# Patient Record
Sex: Female | Born: 1970 | Race: White | Hispanic: No | Marital: Married | Smoking: Former smoker
Health system: Southern US, Community
[De-identification: ages and names within clinical notes are randomized; demographics above are authoritative.]

## PROBLEM LIST (undated history)

## (undated) ENCOUNTER — Inpatient Hospital Stay (HOSPITAL_COMMUNITY): Payer: Self-pay

## (undated) DIAGNOSIS — R51 Headache: Secondary | ICD-10-CM

## (undated) DIAGNOSIS — F411 Generalized anxiety disorder: Secondary | ICD-10-CM

## (undated) DIAGNOSIS — Z973 Presence of spectacles and contact lenses: Secondary | ICD-10-CM

## (undated) DIAGNOSIS — F329 Major depressive disorder, single episode, unspecified: Secondary | ICD-10-CM

## (undated) DIAGNOSIS — M722 Plantar fascial fibromatosis: Secondary | ICD-10-CM

## (undated) DIAGNOSIS — K648 Other hemorrhoids: Principal | ICD-10-CM

## (undated) DIAGNOSIS — I10 Essential (primary) hypertension: Secondary | ICD-10-CM

## (undated) DIAGNOSIS — R768 Other specified abnormal immunological findings in serum: Secondary | ICD-10-CM

## (undated) DIAGNOSIS — K219 Gastro-esophageal reflux disease without esophagitis: Secondary | ICD-10-CM

## (undated) DIAGNOSIS — M7662 Achilles tendinitis, left leg: Secondary | ICD-10-CM

## (undated) DIAGNOSIS — F909 Attention-deficit hyperactivity disorder, unspecified type: Secondary | ICD-10-CM

## (undated) DIAGNOSIS — D649 Anemia, unspecified: Secondary | ICD-10-CM

## (undated) HISTORY — PX: WISDOM TOOTH EXTRACTION: SHX21

## (undated) HISTORY — PX: OVUM / OOCYTE RETRIEVAL: SUR1269

## (undated) HISTORY — PX: DILATION AND CURETTAGE OF UTERUS: SHX78

## (undated) HISTORY — PX: ABDOMINAL HYSTERECTOMY: SHX81

## (undated) HISTORY — DX: Other hemorrhoids: K64.8

## (undated) HISTORY — DX: Gastro-esophageal reflux disease without esophagitis: K21.9

## (undated) HISTORY — DX: Headache: R51

---

## 1991-11-08 HISTORY — PX: FINGER SURGERY: SHX640

## 1999-02-23 ENCOUNTER — Other Ambulatory Visit: Admission: RE | Admit: 1999-02-23 | Discharge: 1999-02-23 | Payer: Self-pay | Admitting: *Deleted

## 2000-03-09 ENCOUNTER — Other Ambulatory Visit: Admission: RE | Admit: 2000-03-09 | Discharge: 2000-03-09 | Payer: Self-pay | Admitting: Obstetrics and Gynecology

## 2001-03-26 ENCOUNTER — Other Ambulatory Visit: Admission: RE | Admit: 2001-03-26 | Discharge: 2001-03-26 | Payer: Self-pay | Admitting: *Deleted

## 2006-03-31 ENCOUNTER — Ambulatory Visit (HOSPITAL_COMMUNITY): Admission: RE | Admit: 2006-03-31 | Discharge: 2006-03-31 | Payer: Self-pay | Admitting: Obstetrics and Gynecology

## 2008-04-07 ENCOUNTER — Encounter (INDEPENDENT_AMBULATORY_CARE_PROVIDER_SITE_OTHER): Payer: Self-pay | Admitting: Obstetrics and Gynecology

## 2008-04-07 ENCOUNTER — Inpatient Hospital Stay (HOSPITAL_COMMUNITY): Admission: AD | Admit: 2008-04-07 | Discharge: 2008-04-09 | Payer: Self-pay | Admitting: Obstetrics and Gynecology

## 2009-03-05 ENCOUNTER — Ambulatory Visit (HOSPITAL_COMMUNITY): Admission: RE | Admit: 2009-03-05 | Discharge: 2009-03-05 | Payer: Self-pay | Admitting: Obstetrics and Gynecology

## 2009-03-05 ENCOUNTER — Encounter (INDEPENDENT_AMBULATORY_CARE_PROVIDER_SITE_OTHER): Payer: Self-pay | Admitting: Obstetrics and Gynecology

## 2009-07-23 ENCOUNTER — Encounter: Admission: RE | Admit: 2009-07-23 | Discharge: 2009-07-23 | Payer: Self-pay | Admitting: Family Medicine

## 2010-03-18 ENCOUNTER — Observation Stay (HOSPITAL_COMMUNITY): Admission: AD | Admit: 2010-03-18 | Discharge: 2010-03-19 | Payer: Self-pay | Admitting: Obstetrics and Gynecology

## 2010-05-14 ENCOUNTER — Inpatient Hospital Stay (HOSPITAL_COMMUNITY): Admission: AD | Admit: 2010-05-14 | Discharge: 2010-05-14 | Payer: Self-pay | Admitting: Obstetrics and Gynecology

## 2010-05-26 ENCOUNTER — Inpatient Hospital Stay (HOSPITAL_COMMUNITY): Admission: RE | Admit: 2010-05-26 | Discharge: 2010-05-28 | Payer: Self-pay | Admitting: Obstetrics and Gynecology

## 2010-06-16 ENCOUNTER — Ambulatory Visit: Payer: Self-pay | Admitting: Family Medicine

## 2010-06-16 DIAGNOSIS — F325 Major depressive disorder, single episode, in full remission: Secondary | ICD-10-CM

## 2010-06-16 DIAGNOSIS — R5383 Other fatigue: Secondary | ICD-10-CM

## 2010-06-16 DIAGNOSIS — K219 Gastro-esophageal reflux disease without esophagitis: Secondary | ICD-10-CM | POA: Insufficient documentation

## 2010-06-16 DIAGNOSIS — I10 Essential (primary) hypertension: Secondary | ICD-10-CM

## 2010-06-16 DIAGNOSIS — E559 Vitamin D deficiency, unspecified: Secondary | ICD-10-CM | POA: Insufficient documentation

## 2010-06-18 ENCOUNTER — Encounter: Payer: Self-pay | Admitting: Family Medicine

## 2010-06-19 DIAGNOSIS — D509 Iron deficiency anemia, unspecified: Secondary | ICD-10-CM

## 2010-06-21 LAB — CONVERTED CEMR LAB
AST: 14 units/L (ref 0–37)
Albumin: 4.1 g/dL (ref 3.5–5.2)
Alkaline Phosphatase: 67 units/L (ref 39–117)
CO2: 19 meq/L (ref 19–32)
Chloride: 111 meq/L (ref 96–112)
Cholesterol: 186 mg/dL (ref 0–200)
Creatinine, Ser: 0.85 mg/dL (ref 0.40–1.20)
Glucose, Bld: 93 mg/dL (ref 70–99)
HDL: 63 mg/dL (ref >39)
Sodium: 142 meq/L (ref 135–145)
TSH: 0.49 microintl units/mL (ref 0.350–4.500)
Total Bilirubin: 0.3 mg/dL (ref 0.3–1.2)
Total Protein: 6 g/dL (ref 6.0–8.3)
Triglycerides: 82 mg/dL (ref <150)
VLDL: 16 mg/dL (ref 0–40)

## 2010-06-22 ENCOUNTER — Encounter: Payer: Self-pay | Admitting: Family Medicine

## 2010-10-26 ENCOUNTER — Ambulatory Visit: Payer: Self-pay | Admitting: Family Medicine

## 2010-10-26 ENCOUNTER — Encounter: Payer: Self-pay | Admitting: Family Medicine

## 2010-10-26 DIAGNOSIS — R454 Irritability and anger: Secondary | ICD-10-CM

## 2010-10-27 LAB — CONVERTED CEMR LAB
Ferritin: 39 ng/mL (ref 10–291)
Iron: 109 ug/dL (ref 42–145)
Platelets: 289 10*3/uL (ref 150–400)
RBC: 4.46 M/uL (ref 3.87–5.11)

## 2010-12-07 NOTE — Assessment & Plan Note (Signed)
Summary: NOV- reflux cough   Vital Signs:  Patient profile:   40 year old female Height:      64 inches Weight:      164 pounds BMI:     28.25 O2 Sat:      99 % on Room air Pulse rate:   90 / minute BP sitting:   142 / 88  (left arm) Cuff size:   regular  Vitals Entered By: Payton Spark CMA (June 16, 2010 2:47 PM)  O2 Flow:  Room air CC: New to est. 3 weeks postpartum. Requests labs and ? TDAP. Also c/o nonproductive cough x 2+ months    Primary Care Provider:  Seymour Bars DO  CC:  New to est. 3 weeks postpartum. Requests labs and ? TDAP. Also c/o nonproductive cough x 2+ months .  History of Present Illness: 40 yo WF presents for for NOV.  She is 3 wks postpartum.  She is married.    She has had a cough x 2 months.  It started when she was pregnant.  Her cough is dry.  It occurs randomly.  Has episodes of sour brash taste and sore throat.  Non smoker.  No hx of asthma.  No sputum or hemoptysis.    She took Prilosec daily during pregnancy and is now taking Zantac as needed.    She has a long hx of depression, well managed on Wellubtrin 300 mg/ day.  She has a supportive husband.      Current Medications (verified): 1)  Wellbutrin Xl 300 Mg Xr24h-Tab (Bupropion Hcl) .... Take 1 Tab By Mouth Once Daily 2)  Prenatal Vitamins 0.8 Mg Tabs (Prenatal Multivit-Min-Fe-Fa) 3)  Zantac 75 75 Mg Tabs (Ranitidine Hcl) .... Take 1-2 Tabs By Mouth Once Daily As Needed  Allergies (verified): No Known Drug Allergies  Past History:  Past Medical History: depression gyn Dr Ellyn Hack  Past Surgical History: colonoscopy: anal fissure 08 IVF 2010. 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 Married to [place].  Has a 27 yo daughter and a newborn son. No exercise. Denies ETOH.  Review of Systems       no  fevers/sweats/weakness, unexplained wt loss/gain, no change in vision, no difficulty hearing, ringing in ears, no hay fever/allergies, no CP/discomfort, no palpitations, no breast lump/nipple discharge, no cough/wheeze, no blood in stool, no N/V/D, no nocturia, no leaking urine, no unusual vag bleeding, no vaginal/penile discharge, no muscle/joint pain, no rash, no new/changing mole, no HA, no memory loss, no anxiety, no sleep problem, no depression, no unexplained lumps, no easy bruising/bleeding, no concern with sexual function   Physical Exam  General:  alert, well-developed, well-nourished, and well-hydrated.   Head:  normocephalic and atraumatic.   Eyes:  sclera non icteric Mouth:  good dentition and pharynx pink and moist.   Neck:  no masses.   Lungs:  Normal respiratory effort, chest expands symmetrically. Lungs are clear to auscultation, no crackles or wheezes.  dry cough Heart:  Normal rate and regular rhythm. S1 and S2 normal without gallop, murmur, click, rub or other extra sounds. Abdomen:  slight epigastric TTP, neg Murphys sign, no hepatomegaly and no splenomegaly.   Extremities:  no LE edema Skin:  color normal.   Cervical Nodes:  No lymphadenopathy noted Psych:  good eye contact, not anxious appearing, and not depressed appearing.  Impression & Recommendations:  Problem # 1:  ESOPHAGEAL REFLUX (ICD-530.81)  She has a reflux induced cough that started with pregnancy.  Will start her on Omeprazole once a day and stick to reflux precautions.  Cough should improve with treatment.  Reassured about normal lung exam and pulse ox.  She is not a smoker and she does have epigastric tenderness and sour brash taste c/w reflux cough.  RTC in 2-3 mos for f/u.  Will try to taper off. Her updated medication list for this problem includes:    Omeprazole 40 Mg Cpdr (Omeprazole) .Marland Kitchen... 1 tab by mouth daily; take 30 min before breakfast  Problem # 2:  DEPRESSION (ICD-311) Long hx of  depression, doing well in the postpartum state on wellbutrin.  continue.  has a good support system.  Will do a PHQ-9 at 2 mos f/u visit. Her updated medication list for this problem includes:    Wellbutrin Xl 300 Mg Xr24h-tab (Bupropion hcl) .Marland Kitchen... Take 1 tab by mouth once daily  Problem # 3:  ELEVATED BLOOD PRESSURE (ICD-796.2) BP slightly high today.  REcheck at 2-3 mos f/u.  Complete Medication List: 1)  Wellbutrin Xl 300 Mg Xr24h-tab (Bupropion hcl) .... Take 1 tab by mouth once daily 2)  Prenatal Vitamins 0.8 Mg Tabs (Prenatal multivit-min-fe-fa) 3)  Omeprazole 40 Mg Cpdr (Omeprazole) .Marland Kitchen.. 1 tab by mouth daily; take 30 min before breakfast  Other Orders: T-Vitamin D (25-Hydroxy) ([id number]) T-TSH [phone]) T-Iron [phone]) T-Comprehensive Metabolic Panel [id number]) T-Lipid Profile [id number])  Patient Instructions: 1)  Tdap today. 2)  Update fasting labs downstairs. 3)  Will call you w/ results. 4)  Start on Omeprazole once a day instead of Zantac for reflux.  Take 20-30 min before breakfast. 5)  REturn for f/u reflux in 3 months and to recheck BP. Prescriptions: OMEPRAZOLE 40 MG CPDR (OMEPRAZOLE) 1 tab by mouth daily; take 30 min before breakfast  #30 x 2   Entered and Authorized by:   Seymour Bars DO   Signed by:   Seymour Bars DO on 06/16/2010   Method used:   Electronically to        [facility] [phone]* (retail)       [address] [place], [place]  [address]       Ph: [phone] or [phone]       Fax: [phone]   RxID:   [id number]

## 2010-12-07 NOTE — Medication Information (Signed)
Summary: Approval for Omeprazole/[employer]  Approval for Omeprazole/[employer]   Imported By: Lanelle Bal 06/29/2010 13:00:48  _____________________________________________________________________  External Attachment:    Type:   Image     Comment:   External Document

## 2010-12-09 NOTE — Assessment & Plan Note (Signed)
Summary: f/u mood   Vital Signs:  Patient profile:   40 year old female Height:      64 inches Weight:      141 pounds BMI:     24.29 O2 Sat:      99 % on Room air Pulse rate:   94 / minute BP sitting:   140 / 84  (left arm) Cuff size:   regular  Vitals Entered By: Payton Spark CMA (October 26, 2010 8:22 AM)  O2 Flow:  Room air CC: F/U reflux, iron and BP   Primary Care Provider:  Seymour Bars DO  CC:  F/U reflux and iron and BP.  History of Present Illness: 40 yo WF presents for f/u reflux.  She is no longer having symptoms.  She took Omeprazole for 1 month.  Her reflux did improve after she had her baby 5 mos ago.    She does note bouts irritabilty, fatigue and nausea.  Wellbutrin is not helping as much as she thinks it should but she has been on many other SSRIs and SNRIs in the past and feels like she needs to be on something.  She has a good appetite but she is losing weight.  She is breastfeeding.  She has poor sleep at night due to nighttime pumping and has yet to start getting exercise.  She is also back to work now.  She stopped the RX iron after taking it for a month.      Current Medications (verified): 1)  Wellbutrin Xl 300 Mg Xr24h-Tab (Bupropion Hcl) .... Take 1 Tab By Mouth Once Daily 2)  Omeprazole 40 Mg Cpdr (Omeprazole) .Marland Kitchen.. 1 Tab By Mouth Daily; Take 30 Min Before Breakfast 3)  Vitamin D  Allergies (verified): No Known Drug Allergies  Past History:  Past Medical History: Reviewed history from 06/16/2010 and no changes required. depression gyn Dr Ellyn Hack  Past Surgical History: Reviewed history from 06/16/2010 and no changes required. colonoscopy: anal fissure 08 IVF 2010. NSVD x 2  Family History: Reviewed history from 06/16/2010 and no changes required. father HTN mother depression  2 brothers and 1 sister healthy uncle brain cancer GF lung cancer GM depression  Social History: Reviewed history from 06/16/2010 and no changes  required. 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  Has a 53 yo daughter and a newborn son. No exercise. Denies ETOH.  Review of Systems      See HPI  Physical Exam  General:  alert, well-developed, well-nourished, and well-hydrated.   Head:  normocephalic and atraumatic.   Mouth:  pharynx pink and moist.   Neck:  no masses.   Lungs:  Normal respiratory effort, chest expands symmetrically. Lungs are clear to auscultation, no crackles or wheezes. Heart:  Normal rate and regular rhythm. S1 and S2 normal without gallop, murmur, click, rub or other extra sounds. Abdomen:  soft, non-tender, normal bowel sounds, no distention, no masses, no guarding, no hepatomegaly, and no splenomegaly.   Skin:  color normal.  no pallor Psych:  good eye contact, not anxious appearing, and not depressed appearing.     Impression & Recommendations:  Problem # 1:  DEPRESSION (ICD-311) We discussed possibly making some changes but she is nursing and is has been on multiple other agents in the past, none of which has helped much.  I do think that the Wellbutrin is helping some and that she would benefit from improving her sleep at night, regularly exercising and working on stress reduction.  Her updated medication list for this problem includes:    Wellbutrin Xl 300 Mg Xr24h-tab (Bupropion hcl) .Marland Kitchen... Take 1 tab by mouth once daily  Problem # 2:  IRON DEFICIENCY (ICD-280.9) Off RX iron.  rEcheck labs today. The following medications were removed from the medication list:    Integra Plus Caps (Fefum-fepoly-fa-b cmp-c-biot) .Marland Kitchen... 1 tab by mouth daily  Orders: T-CBC No Diff ([id number]) T-Iron [phone]) T-Iron Binding Capacity (TIBC) ([id number]) T-Ferritin ([id number])  Problem # 3:  ESOPHAGEAL REFLUX (ICD-530.81) Assessment: Improved Improved 5 mos post partum and is off PPI.  Not even needing anything for rescue symptoms. The following medications were removed from the  medication list:    Omeprazole 40 Mg Cpdr (Omeprazole) .Marland Kitchen... 1 tab by mouth daily; take 30 min before breakfast  Complete Medication List: 1)  Wellbutrin Xl 300 Mg Xr24h-tab (Bupropion hcl) .... Take 1 tab by mouth once daily 2)  Vitamin D   Other Orders: T-TSH ([id number])  Patient Instructions: 1)  Labs today. 2)  Will call you w/ results tomorrow. 3)  Stay on Wellbutrin. 4)  Work on improving sleep and adding regular exercise, (consider yoga for relaxation/ meditation). 5)  Call back if any problems. 6)  REturn for follow up mood in 4 mos.   Orders Added: 1)  T-CBC No Diff [[id number]] 2)  T-Iron [[id number]] 3)  T-Iron Binding Capacity (TIBC) [[id number]] 4)  T-TSH [[id number]] 5)  T-Ferritin [[id number]] 6)  Est. Patient Level IV [88416]

## 2011-01-22 LAB — CBC
Hemoglobin: 12.2 g/dL (ref 12.0–15.0)
MCHC: 35.8 g/dL (ref 30.0–36.0)
MCV: 94.1 fL (ref 78.0–100.0)
Platelets: 213 10*3/uL (ref 150–400)
Platelets: 216 10*3/uL (ref 150–400)
RBC: 2.73 MIL/uL — ABNORMAL LOW (ref 3.87–5.11)
RBC: 3.74 MIL/uL — ABNORMAL LOW (ref 3.87–5.11)
RDW: 14.2 % (ref 11.5–15.5)
WBC: 14.7 10*3/uL — ABNORMAL HIGH (ref 4.0–10.5)

## 2011-01-22 LAB — RPR: RPR Ser Ql: NONREACTIVE

## 2011-02-16 LAB — CBC
Hemoglobin: 12.9 g/dL (ref 12.0–15.0)
MCV: 94.3 fL (ref 78.0–100.0)
Platelets: 249 10*3/uL (ref 150–400)
RBC: 3.83 MIL/uL — ABNORMAL LOW (ref 3.87–5.11)
WBC: 8.6 10*3/uL (ref 4.0–10.5)

## 2011-03-22 NOTE — Op Note (Signed)
Anita Alexander, Anita Alexander                ACCOUNT NO.:  [id number]   MEDICAL RECORD NO.:  [id number]          PATIENT TYPE:  AMB   LOCATION:  SDC                           FACILITY:  [facility]   PHYSICIAN:  Zenaida Niece, M.D.DATE OF BIRTH:  [birth year]   DATE OF PROCEDURE:  03/05/2009  DATE OF DISCHARGE:                               OPERATIVE REPORT   PREOPERATIVE DIAGNOSIS:  Missed abortion.   POSTOPERATIVE DIAGNOSIS:  Missed abortion.   PROCEDURE:  Dilation and evacuation.   SURGEON:  Zenaida Niece, MD   ANESTHESIA:  Monitored anesthesia care and local paracervical block.   FINDINGS:  Slightly enlarged uterus and cervix was easily dilated.   SPECIMENS:  Products of conception sent for routine pathology.   ESTIMATED BLOOD LOSS:  Minimal.   COMPLICATIONS:  None.   PROCEDURE IN DETAIL:  The patient was taken to the operating room and  placed in the dorsal supine position.  She was given IV sedation and  placed on mobile stirrups.  Perineum and vagina were then prepped and  draped in the usual sterile fashion and bladder drained with a red  Robinson catheter.  A Graves speculum was then inserted into the vagina.  The anterior lip of the cervix was grasped with a single-tooth  tenaculum.  Paracervical block was then performed with a total of 16 mL  of 2% plain lidocaine.  The uterus then sounded to 9 cm and the cervix  was gradually easily dilated to accommodate a size 23 dilator.  A size 7  curved suction curette was then inserted and suction curettage was  performed with return of products of conception without difficulty.  Sharp curettage was then performed with good uterine cry in all  quadrants with possibly a small amount of tissue posteriorly.  Suction  curettage was performed one more time with return of a small amount of  tissue and minimal blood.  This was then removed.  The single-tooth  tenaculum was removed.  Bleeding was controlled with pressure and the  speculum  was removed.  The patient was taken down from stirrups.  She  was awakened in the operating room and taken to the recovery room in  stable condition after tolerating the procedure well.  Counts were  correct.      Zenaida Niece, M.D.  Electronically Signed     TDM/MEDQ  D:  03/05/2009  T:  03/06/2009  Job:  [id number]

## 2011-03-22 NOTE — Discharge Summary (Signed)
Anita Alexander, Anita Alexander                ACCOUNT NO.:  [id number]   MEDICAL RECORD NO.:  [id number]          PATIENT TYPE:  INP   LOCATION:  9124                          FACILITY:  [facility]   PHYSICIAN:  Malachi Pro. Ambrose Mantle, M.D. DATE OF BIRTH:  1971-01-22   DATE OF ADMISSION:  04/07/2008  DATE OF DISCHARGE:  04/09/2008                               DISCHARGE SUMMARY   A 40 year old white female para 0 gravida 1 at 51 weeks' gestation,  pregnancy conceived with IUI, presented to labor and delivery with  contractions every 5-8 minutes and cervical change to 2-cm, 90%, vertex  at -1.  Prenatal care was uneventful except for the history of anxiety  and depression treated with Wellbutrin by a psychiatrist, intrauterine  insemination for history of infertility.  Blood group and type A  positive, negative antibody, nonreactive serology, rubella immune,  hepatitis B surface antigen negative, HIV negative, GC negative, and  chlamydia negative, group B strep negative, one-hour glucola 97.  First  trimester screen was normal.  The patient had a history of anxiety,  depression, migraines, mitral valve prolapse not requiring antibiotics,  and GERD.   OBSTETRICAL HISTORY:  Negative.   GYN HISTORY:  Positive for infertility.   SURGICAL HISTORY:  Negative.   ALLERGIES:  No known allergies.   MEDICATIONS:  Zantac 150 mg 3 times a day, Wellbutrin 300 mg every day,  and prenatal vitamins.   On admission, the patient's vital signs were normal.  Heart and lungs  were normal.  The abdomen was soft and nontender.  Cervix was 2-3 cm,  90%.  The patient was admitted and received an epidural, and  intrauterine pressure catheter was placed, at 1:30 p.m. was contracting  every 2-3 minutes on 8 milliunits per minute of Pitocin.  Cervix was 6-7  cm per the nurse.  She reached full dilatation with meconium-stained  fluid.  She pushed well and delivered spontaneously OA over bilateral  deep vaginal lacerations, a  living female infant 7 pounds 14 ounces,  Apgars of 7 at 1 minute and 9 at 5 minutes.  Dr. Venetia Constable was in  attendance.  There was no meconium below the cords.  There was  significant bleeding from the vaginal lacerations.  The placenta did not  separate promptly so packs were used to identify the lacerations, and  they were repaired with 3-0 Vicryl.  Placenta was removed intact.  Uterus was normal.  All packs were removed and rectal was negative  before and after repair.  Blood loss was about 600 mL.  Postpartum, the  patient did well and was discharged on the second postpartum day.   LABORATORY DATA:  Initial hemoglobin was 12.0, hematocrit 34.0, white  count 10,700, and platelet count 265,000.  Followup hemoglobin 12.1 and  hematocrit 35.1.   FINAL DIAGNOSES:  Intrauterine pregnancy at 40 weeks, delivered OA.   OPERATION:  Spontaneous delivery OA, repair of bilateral vaginal  lacerations.   FINAL CONDITION:  Improved.   INSTRUCTIONS:  Include our regular discharge instruction booklet.  Percocet 5/325 mg 30 tablets 1 every 4-6 hours as needed  for pain and  Motrin 600 mg 30 tablets 1 every 6 hours as needed for pain.  The  patient is advised to follow our discharge instruction booklet and to  take her Wellbutrin and Zantac as before the delivery and return to the  office in 6 weeks for followup examination.      Malachi Pro. Ambrose Mantle, M.D.  Electronically Signed     TFH/MEDQ  D:  04/09/2008  T:  04/09/2008  Job:  [id number]

## 2011-07-15 ENCOUNTER — Other Ambulatory Visit (HOSPITAL_COMMUNITY): Payer: Self-pay | Admitting: Obstetrics and Gynecology

## 2011-07-15 DIAGNOSIS — Z1231 Encounter for screening mammogram for malignant neoplasm of breast: Secondary | ICD-10-CM

## 2011-07-22 ENCOUNTER — Ambulatory Visit (HOSPITAL_COMMUNITY)
Admission: RE | Admit: 2011-07-22 | Discharge: 2011-07-22 | Disposition: A | Discharge status: Home / Self Care | Payer: BC Managed Care – PPO | Source: Ambulatory Visit | Attending: Obstetrics and Gynecology | Admitting: Obstetrics and Gynecology

## 2011-07-22 DIAGNOSIS — Z1231 Encounter for screening mammogram for malignant neoplasm of breast: Secondary | ICD-10-CM | POA: Insufficient documentation

## 2011-08-04 LAB — CCBB MATERNAL DONOR DRAW

## 2011-08-04 LAB — CBC
HCT: 35.1 — ABNORMAL LOW
Hemoglobin: 12
Hemoglobin: 12.1
MCHC: 35.4
MCV: 89.9
WBC: 16.6 — ABNORMAL HIGH

## 2011-08-04 LAB — RPR: RPR Ser Ql: NONREACTIVE

## 2012-04-11 ENCOUNTER — Ambulatory Visit (INDEPENDENT_AMBULATORY_CARE_PROVIDER_SITE_OTHER): Payer: 59 | Admitting: Physician Assistant

## 2012-04-11 ENCOUNTER — Encounter: Payer: Self-pay | Admitting: Physician Assistant

## 2012-04-11 VITALS — BP 123/77 | HR 98 | Temp 98.5°F | Ht 65.0 in | Wt 147.0 lb

## 2012-04-11 DIAGNOSIS — K219 Gastro-esophageal reflux disease without esophagitis: Secondary | ICD-10-CM

## 2012-04-11 DIAGNOSIS — R1011 Right upper quadrant pain: Secondary | ICD-10-CM

## 2012-04-11 MED ORDER — PANTOPRAZOLE SODIUM 40 MG PO TBEC: 40.0000 mg | DELAYED_RELEASE_TABLET | Freq: Every day | ORAL | Status: DC

## 2012-04-11 NOTE — Patient Instructions (Signed)
Gave protonix to start 40mg  daily first thing in the morning. Should see results in about a 1 week. If not improving then we will scheduled EGD.

## 2012-04-11 NOTE — Progress Notes (Signed)
  Subjective:    Patient ID: Anita Alexander, female    DOB: 1971/08/11, 41 y.o.   MRN: [id number]  HPI Patient presents to the clinic with right upper quadrant pain for the last 6 weeks. She has had episodes like this one for the last 2 years. They last for 6-8 weeks and then resolve on their own. She has had numerous Gallbladder ultrasounds that were normal. [facility] and [facility] were going to schedule EGD but pain resolved and she never got it done. She does not noticed any triggers except maybe stress. She does not experience any worsening with food. She does have a hx of acid reflux but has not been on any medication is a while. She does report that she has felt like her acid reflex symptoms have worsened over the last couple of months. She denies any N/V or bowel changes. The pain is a dull cramp that she describes as more annoying that painful. She rates pain 4/10. She denies fever or chills. She has not tried anything to make better.     Review of Systems     Objective:   Physical Exam  Constitutional: She is oriented to person, place, and time. She appears well-developed and well-nourished.  HENT:  Head: Normocephalic and atraumatic.  Cardiovascular: Normal rate, regular rhythm and normal heart sounds.   Pulmonary/Chest: Effort normal and breath sounds normal. She has no wheezes.  Abdominal: Soft. Bowel sounds are normal. She exhibits no distension.       Tenderness with deep palpation over right upper quadrant and epigastric area. No rebound tenderness.   Stretch marks over abdomen.   Neurological: She is alert and oriented to person, place, and time.  Skin: Skin is warm and dry.  Psychiatric: She has a normal mood and affect. Her behavior is normal.          Assessment & Plan:    Esophageal reflux/right upper quadrant abdominal pain- Has had multiple gallbladder u/s all normal. Due to hx of worsening during stress and personal hx of acid reflux that we  should restart protonix daily and she if it helps her symptoms/pain. If no relief we can consider EGD/GI referral. Stay away from caffeine, sodas, spicy foods, and NSAIDS. CBC to look at hemoglobin.

## 2012-04-12 LAB — CBC WITH DIFFERENTIAL/PLATELET
Basophils Relative: 0 % (ref 0–1)
Eosinophils Absolute: 0.1 10*3/uL (ref 0.0–0.7)
HCT: 39.6 % (ref 36.0–46.0)
Lymphocytes Relative: 41 % (ref 12–46)
MCH: 31.6 pg (ref 26.0–34.0)
Monocytes Absolute: 0.6 10*3/uL (ref 0.1–1.0)
Neutro Abs: 3.6 10*3/uL (ref 1.7–7.7)
Platelets: 283 10*3/uL (ref 150–400)
RDW: 13.7 % (ref 11.5–15.5)

## 2012-04-12 NOTE — Progress Notes (Signed)
Quick Note:  Call patient with results. Let them know all labs are within normal limits. ______ 

## 2012-09-27 ENCOUNTER — Encounter (HOSPITAL_COMMUNITY): Payer: Self-pay

## 2012-09-27 ENCOUNTER — Encounter (HOSPITAL_COMMUNITY): Payer: Self-pay | Admitting: *Deleted

## 2012-10-01 ENCOUNTER — Ambulatory Visit (HOSPITAL_COMMUNITY): Admission: RE | Admit: 2012-10-01 | Payer: 59 | Source: Ambulatory Visit | Admitting: Obstetrics and Gynecology

## 2012-10-01 ENCOUNTER — Encounter (HOSPITAL_COMMUNITY): Payer: Self-pay | Admitting: Registered Nurse

## 2012-10-01 ENCOUNTER — Encounter (HOSPITAL_COMMUNITY): Admission: RE | Payer: Self-pay | Source: Ambulatory Visit

## 2012-10-01 SURGERY — DILATION AND EVACUATION, UTERUS
Anesthesia: General

## 2012-12-26 ENCOUNTER — Encounter: Payer: Self-pay | Admitting: Physician Assistant

## 2012-12-26 ENCOUNTER — Ambulatory Visit (INDEPENDENT_AMBULATORY_CARE_PROVIDER_SITE_OTHER): Payer: 59 | Admitting: Physician Assistant

## 2012-12-26 VITALS — BP 131/80 | HR 85 | Wt 156.0 lb

## 2012-12-26 DIAGNOSIS — R0789 Other chest pain: Secondary | ICD-10-CM

## 2012-12-26 DIAGNOSIS — R059 Cough, unspecified: Secondary | ICD-10-CM

## 2012-12-26 DIAGNOSIS — R05 Cough: Secondary | ICD-10-CM

## 2012-12-26 DIAGNOSIS — F411 Generalized anxiety disorder: Secondary | ICD-10-CM

## 2012-12-26 DIAGNOSIS — K219 Gastro-esophageal reflux disease without esophagitis: Secondary | ICD-10-CM

## 2012-12-26 MED ORDER — PANTOPRAZOLE SODIUM 40 MG PO TBEC: 40.0000 mg | DELAYED_RELEASE_TABLET | Freq: Every day | ORAL | Status: DC

## 2012-12-26 MED ORDER — HYDROCODONE-HOMATROPINE 5-1.5 MG/5ML PO SYRP: 5.0000 mL | ORAL_SOLUTION | Freq: Every evening | ORAL | Status: DC | PRN

## 2012-12-26 MED ORDER — BUPROPION HCL ER (XL) 150 MG PO TB24: 150.0000 mg | ORAL_TABLET | Freq: Every day | ORAL | Status: DC

## 2012-12-26 MED ORDER — BUSPIRONE HCL 7.5 MG PO TABS: 7.5000 mg | ORAL_TABLET | Freq: Three times a day (TID) | ORAL | Status: DC

## 2012-12-26 NOTE — Patient Instructions (Addendum)
STart Wellburtin once a day. Start buspar twice a day and can increase to three times a day. Follow up in 6 weeks.

## 2012-12-26 NOTE — Progress Notes (Addendum)
  Subjective:    Patient ID: Anita Alexander, female    DOB: [birth year], 42 y.o.   MRN: [id number]  HPI Patient is a 42 yo female who presents to the clinic with chest tightness for about 10 days. Tightness is best in the morning and gets worse throughout the day but not necessarly with exertion. Nothing makes worse just gradually get there. No chest pain and no radiation. NO jaw pain or arm pain. Denies any fever, SOB, wheezing. Does have a cough that just started and is not productive. Pt has depression and anxiety and taking wellbutrin SR once a day. She feels overwhelmed. She has been seen for acid reflux problems in the past but those symptoms are resolved for the most part. She has ran out of protonix and having to use OTC prilosec but not regularly.    Review of Systems     Objective:   Physical Exam  Constitutional: She is oriented to person, place, and time. She appears well-developed and well-nourished.  HENT:  Head: Normocephalic and atraumatic.  Right Ear: External ear normal.  Left Ear: External ear normal.  Nose: Nose normal.  Mouth/Throat: Oropharynx is clear and moist.  Eyes: Conjunctivae are normal.  Neck: Normal range of motion. Neck supple.  Cardiovascular: Normal rate, regular rhythm and normal heart sounds.   Pulmonary/Chest: Effort normal and breath sounds normal.  Abdominal: Soft. Bowel sounds are normal. There is no tenderness.  Lymphadenopathy:    She has no cervical adenopathy.  Neurological: She is alert and oriented to person, place, and time.  Skin: Skin is warm and dry.  Psychiatric: She has a normal mood and affect. Her behavior is normal.          Assessment & Plan:  Anxiety/Chest tightness-discussed with patient sounded stress related. GAD-7 was 15. PHQ-9 was 6.  Pt not on adequate dose of wellbutrin put on XR once a day 150 and added buspar to see if helped with chest tightness. Follow up in 6 weeks.   Cough- given hycodan to use at night.    Acid reflux- Go back on protonix to see if that could be causing some discomfort. Refilled.

## 2013-02-08 ENCOUNTER — Ambulatory Visit (INDEPENDENT_AMBULATORY_CARE_PROVIDER_SITE_OTHER): Payer: 59 | Admitting: Physician Assistant

## 2013-02-08 ENCOUNTER — Encounter: Payer: Self-pay | Admitting: Physician Assistant

## 2013-02-08 VITALS — BP 134/75 | HR 75 | Wt 155.0 lb

## 2013-02-08 DIAGNOSIS — F329 Major depressive disorder, single episode, unspecified: Secondary | ICD-10-CM

## 2013-02-08 DIAGNOSIS — K219 Gastro-esophageal reflux disease without esophagitis: Secondary | ICD-10-CM

## 2013-02-08 DIAGNOSIS — F411 Generalized anxiety disorder: Secondary | ICD-10-CM | POA: Insufficient documentation

## 2013-02-08 MED ORDER — BUPROPION HCL ER (XL) 300 MG PO TB24: 300.0000 mg | ORAL_TABLET | Freq: Every day | ORAL | Status: DC

## 2013-02-08 MED ORDER — BUSPIRONE HCL 15 MG PO TABS: 15.0000 mg | ORAL_TABLET | Freq: Two times a day (BID) | ORAL | Status: DC

## 2013-02-08 NOTE — Progress Notes (Signed)
  Subjective:    Patient ID: Anita Alexander, female    DOB: 19-Dec-1970, 42 y.o.   MRN: [id number]  HPI Patient presents to the clinic to follow up on anxiety and depression. She is doing much better today. She did increase on her on Wellbutrin to 300mg  daily. Her husband has noticed the difference and so has the rest of her family. The buspar helps also but she can never remember the last dose. Her chest tightness has resolved since last visit. Started protonix taking daily. Acid reflux symptoms are very well controlled. Diet certainly effects symptoms and aware of triggers.   Thinking about IVF in the near future. Wonders about saftely of current medications.   Review of Systems     Objective:   Physical Exam  Constitutional: She is oriented to person, place, and time. She appears well-developed and well-nourished.  HENT:  Head: Normocephalic and atraumatic.  Cardiovascular: Normal rate, regular rhythm and normal heart sounds.   Pulmonary/Chest: Effort normal and breath sounds normal. She has no wheezes.  Neurological: She is alert and oriented to person, place, and time.  Skin: Skin is warm and dry.  Psychiatric: She has a normal mood and affect. Her behavior is normal.          Assessment & Plan:  Depression/Anxiety- GAD-7 was 4. PHQ-9 was 5. Much better from the other day. Refilled higher dose of wellbutrin for 6 months. Changed Buspar to 15mg  but made it twice a day since not remembering the 3rd dose. Call if worsening symptoms. Otherwise follow up in 6 months.   Acid Reflux- REfilled protonix. Discussed diet that controls GERD. Use protonix as needed to control symptoms may consider every other day treatment.   Medication management- discussed that buspar and protonix are catergory B for pregnancy and considered safe. Wellbutrin is category C. I would talk to OBGYN since she stated she stayed on for both of her pregnancy.

## 2013-03-11 ENCOUNTER — Other Ambulatory Visit (HOSPITAL_COMMUNITY): Payer: Self-pay | Admitting: Obstetrics and Gynecology

## 2013-03-11 DIAGNOSIS — Z1231 Encounter for screening mammogram for malignant neoplasm of breast: Secondary | ICD-10-CM

## 2013-03-13 ENCOUNTER — Ambulatory Visit (HOSPITAL_COMMUNITY)
Admission: RE | Admit: 2013-03-13 | Discharge: 2013-03-13 | Disposition: A | Discharge status: Home / Self Care | Payer: 59 | Source: Ambulatory Visit | Attending: Obstetrics and Gynecology | Admitting: Obstetrics and Gynecology

## 2013-03-13 DIAGNOSIS — Z1231 Encounter for screening mammogram for malignant neoplasm of breast: Secondary | ICD-10-CM | POA: Insufficient documentation

## 2013-03-29 ENCOUNTER — Encounter: Payer: Self-pay | Admitting: Family Medicine

## 2013-03-29 ENCOUNTER — Ambulatory Visit (INDEPENDENT_AMBULATORY_CARE_PROVIDER_SITE_OTHER): Payer: 59 | Admitting: Family Medicine

## 2013-03-29 VITALS — BP 130/83 | HR 85 | Temp 98.1°F | Wt 156.0 lb

## 2013-03-29 DIAGNOSIS — R1011 Right upper quadrant pain: Secondary | ICD-10-CM

## 2013-03-29 DIAGNOSIS — J209 Acute bronchitis, unspecified: Secondary | ICD-10-CM

## 2013-03-29 LAB — COMPLETE METABOLIC PANEL WITH GFR
BUN: 9 mg/dL (ref 6–23)
CO2: 22 mEq/L (ref 19–32)
Calcium: 8.7 mg/dL (ref 8.4–10.5)
Chloride: 107 mEq/L (ref 96–112)
Creat: 0.75 mg/dL (ref 0.50–1.10)
GFR, Est African American: >89 mL/min
Total Bilirubin: 0.3 mg/dL (ref 0.3–1.2)

## 2013-03-29 LAB — GAMMA GT: GGT: 23 U/L (ref 7–51)

## 2013-03-29 MED ORDER — AZITHROMYCIN 250 MG PO TABS: ORAL_TABLET | ORAL | Status: AC

## 2013-03-29 MED ORDER — HYDROCODONE-HOMATROPINE 5-1.5 MG/5ML PO SYRP: 5.0000 mL | ORAL_SOLUTION | Freq: Four times a day (QID) | ORAL | Status: DC | PRN

## 2013-03-29 NOTE — Progress Notes (Signed)
CC: Anita Alexander is a 42 y.o. female is here for RUQ pain   Subjective: HPI:  Patient complains of over 5 years of right upper quadrant pain is described as a cramping and spasm that frequently radiates to the right shoulder blade. Present on a daily basis. There'll be months where this pain is absent only for to flare for 2-4 months. Nothing particularly makes it better or worse, is most noticeable around 8 PM every evening regardless of what she eats or when she eats. Has tried Protonix which doesn't help, reports multiple ultrasounds of the gallbladder unremarkable most recently 2010. Pain has not changed in character or severity since onset years ago. Pain is mild to moderate severity. She denies nausea, vomiting, unintentional weight loss, diarrhea, constipation, skin changes overlying the site of discomfort, shortness of breath, wheezing, fevers, chills, skin or scleral discoloration. Denies tar-like stools nor blood in stool  Patient complains of a cough present for one week worsening on a daily basis described as nonproductive present all hours of the day and interfered with sleep. Robitussin slightly improves, nothing else makes better or worse. Denies shortness of breath, unintentional weight loss, night sweats, chills, fevers    Review Of Systems Outlined In HPI  Past Medical History  Diagnosis Date  . SVD (spontaneous vaginal delivery)     x 2  . MVP (mitral valve prolapse)     no problems, no prior treatment for dental/surgical per pt  . Anxiety     no meds currently  . Depression     no meds currently     No family history on file.   History  Substance Use Topics  . Smoking status: Former Smoker -- 1.00 packs/day for 7 years    Types: Cigarettes    Quit date: 11/07/1998  . Smokeless tobacco: Not on file  . Alcohol Use: Yes     Comment: daily     Objective: Filed Vitals:   03/29/13 0847  BP: 130/83  Pulse: 85  Temp: 98.1 F (36.7 C)    General: Alert and  Oriented, No Acute Distress HEENT: Pupils equal, round, reactive to light. Conjunctivae clear.  External ears unremarkable, canals clear with intact TMs with appropriate landmarks.  Middle ear appears open without effusion. Pink inferior turbinates.  Moist mucous membranes, pharynx without inflammation nor lesions.  Neck supple without palpable lymphadenopathy nor abnormal masses. Lungs: Mild central rhonchi without rales or wheezing  Comfortable work of breathing. Good air movement. Cardiac: Regular rate and rhythm. Normal S1/S2.  No murmurs, rubs, nor gallops.   Abdomen: No palpable masses, Murphy's sign positive, other than right upper quadrant no pain elsewhere nor rebound Extremities: No peripheral edema.  Strong peripheral pulses.  Mental Status: No depression, anxiety, nor agitation. Skin: Warm and dry.  Assessment & Plan: Fama was seen today for ruq pain.  Diagnoses and associated orders for this visit:  Acute bronchitis - azithromycin (ZITHROMAX) 250 MG tablet; Take two tabs at once on day 1, then one tab daily on days 2-5. - HYDROcodone-homatropine (HYCODAN) 5-1.5 MG/5ML syrup; Take 5 mLs by mouth every 6 (six) hours as needed for cough.  RUQ pain - COMPLETE METABOLIC PANEL WITH GFR - Gamma GT - NM Hepatobiliary; Future     Right upper quadrant pain: Concern for biliary dyskinesia will order HIDA scan and labs above, unlikely that ultrasound would be of much benefit at this time given frequent unremarkable images with similar pain., May consider increasing Protonix to twice  a day however doubtful this will be much benefit.  Acute bronchitis: Start azithromycin and Hycodan on an as-needed basis.Signs and symptoms requring emergent/urgent reevaluation were discussed with the patient.  Return in about 4 weeks (around 04/26/2013).

## 2013-04-19 ENCOUNTER — Other Ambulatory Visit: Payer: Self-pay | Admitting: Physician Assistant

## 2013-04-19 ENCOUNTER — Telehealth: Payer: Self-pay | Admitting: Family Medicine

## 2013-04-19 NOTE — Telephone Encounter (Signed)
Waiting to hear back from [facility] radiology regarding whether or not (desired) CCK included in HIDA scan.

## 2013-04-22 ENCOUNTER — Other Ambulatory Visit (HOSPITAL_COMMUNITY): Payer: 59

## 2013-04-22 NOTE — Telephone Encounter (Signed)
Anita Alexander, Can you please contact the [place] [facility] radiology department and see if her June 25th HIDA scan is going to include a CCK component.  I want CCK to be included, I just can't tell by the order whether or not it's included.

## 2013-04-22 NOTE — Telephone Encounter (Signed)
The HIDA scan did not include the CCK component but the order has been changed by the scheduler. You will receive a message to sign off on this order in your in basket

## 2013-05-01 ENCOUNTER — Telehealth: Payer: Self-pay | Admitting: Family Medicine

## 2013-05-01 ENCOUNTER — Encounter (HOSPITAL_COMMUNITY)
Admission: RE | Admit: 2013-05-01 | Discharge: 2013-05-01 | Disposition: A | Discharge status: Home / Self Care | Payer: 59 | Source: Ambulatory Visit | Attending: Family Medicine | Admitting: Family Medicine

## 2013-05-01 ENCOUNTER — Encounter (HOSPITAL_COMMUNITY): Payer: Self-pay

## 2013-05-01 DIAGNOSIS — R1011 Right upper quadrant pain: Secondary | ICD-10-CM | POA: Insufficient documentation

## 2013-05-01 MED ORDER — TECHNETIUM TC 99M MEBROFENIN IV KIT
5.4000 | PACK | Freq: Once | INTRAVENOUS | Status: AC | PRN
  Administered 2013-05-01: 5.7 via INTRAVENOUS

## 2013-05-01 NOTE — Telephone Encounter (Signed)
Anita Alexander/Anita Alexander, Will you please let Anita Alexander know that her HIDA scan revealed a normal functioning gallbladder, since I can't find a source of her right upper quadrant pain I'll place a referral to our Gastroenterology colleagues for futher workup.

## 2013-05-01 NOTE — Telephone Encounter (Signed)
Pt notified of results and referral placed. Barry Dienes, LPN

## 2013-05-02 ENCOUNTER — Encounter: Payer: Self-pay | Admitting: Gastroenterology

## 2013-05-22 ENCOUNTER — Ambulatory Visit: Payer: 59 | Admitting: Gastroenterology

## 2013-08-12 ENCOUNTER — Ambulatory Visit: Payer: 59 | Admitting: Physician Assistant

## 2013-08-19 ENCOUNTER — Encounter: Payer: Self-pay | Admitting: Physician Assistant

## 2013-08-19 ENCOUNTER — Ambulatory Visit (INDEPENDENT_AMBULATORY_CARE_PROVIDER_SITE_OTHER): Payer: 59 | Admitting: Physician Assistant

## 2013-08-19 VITALS — BP 139/75 | HR 78 | Wt 161.0 lb

## 2013-08-19 DIAGNOSIS — F411 Generalized anxiety disorder: Secondary | ICD-10-CM

## 2013-08-19 DIAGNOSIS — F3289 Other specified depressive episodes: Secondary | ICD-10-CM

## 2013-08-19 DIAGNOSIS — L299 Pruritus, unspecified: Secondary | ICD-10-CM

## 2013-08-19 DIAGNOSIS — K219 Gastro-esophageal reflux disease without esophagitis: Secondary | ICD-10-CM

## 2013-08-19 DIAGNOSIS — Z23 Encounter for immunization: Secondary | ICD-10-CM

## 2013-08-19 DIAGNOSIS — Z131 Encounter for screening for diabetes mellitus: Secondary | ICD-10-CM

## 2013-08-19 DIAGNOSIS — Z13220 Encounter for screening for lipoid disorders: Secondary | ICD-10-CM

## 2013-08-19 DIAGNOSIS — F329 Major depressive disorder, single episode, unspecified: Secondary | ICD-10-CM

## 2013-08-19 MED ORDER — PANTOPRAZOLE SODIUM 40 MG PO TBEC: 40.0000 mg | DELAYED_RELEASE_TABLET | Freq: Every day | ORAL | Status: DC

## 2013-08-19 MED ORDER — BUSPIRONE HCL 15 MG PO TABS: 15.0000 mg | ORAL_TABLET | Freq: Two times a day (BID) | ORAL | Status: DC

## 2013-08-19 MED ORDER — BUPROPION HCL ER (XL) 300 MG PO TB24: 300.0000 mg | ORAL_TABLET | Freq: Every day | ORAL | Status: DC

## 2013-08-19 NOTE — Progress Notes (Signed)
  Subjective:    Patient ID: Anita Alexander, female    DOB: [birth year], 42 y.o.   MRN: [id number]  HPI Patient presents to the clinic to followup on anxiety and depression. She is doing very well today. She feels like symptoms are very much under control. She does so like the addition of BuSpar has helped anxiety symptoms significantly. She is also a little concerned because she's developed some itching that can be all over her body for brief episodes and then resolves. This has been ongoing since starting BuSpar. She is not constant but off and on. She has not tried anything to help her make better. Sunday she skips a day she stopped. It is not significant enough that she would like to stop BuSpar. She denies any shortness of breath or problems swallowing.  Patient also has recurrent right upper quadrant abdominal pain. She was seen by Dr. Kary Kos over the summer. He ordered a HIDA scan that was normal. He had referred her to GI. She never went to GI because the symptoms stopped. They had started back with the last couple of weeks. They are not severe but a mild" or pain. She has notice that they increase her relationship to alcohol. She drinks alcohol she will have the pain.   Review of Systems     Objective:   Physical Exam  Constitutional: She is oriented to person, place, and time. She appears well-developed and well-nourished.  HENT:  Head: Normocephalic and atraumatic.  Neck: Normal range of motion. Neck supple. No thyromegaly present.  Cardiovascular: Normal rate, regular rhythm and normal heart sounds.   Pulmonary/Chest: Effort normal and breath sounds normal. She has no wheezes.  Neurological: She is alert and oriented to person, place, and time.  Skin: Skin is warm and dry.  Psychiatric: She has a normal mood and affect. Her behavior is normal.          Assessment & Plan:  Depression/Anxiety- PHQ-9 was 2. GAD-7 was 3. I refilled patient's Wellbutrin and BuSpar for one year.  We'll check liver and kidney with CMP. Since patient was having some occasional itching since starting BuSpar told her to take Zyrtec daily to see if helps with itching. I reassured her that I had not heard of that as a side effect.  Screening labs-I also did order a screening lipid panel since patient had not had one in a few years.  GERD/right upper quadrant pain-encouraged patient to make GI referral to have further evaluation of pain. Both ultrasound and HIDA scan were normal. Recommended stop drinking alcohol since this seems to be a caustic of pain. Continue with protonix. Refill today for 3 months.  Flu shot given without complications.

## 2013-08-20 LAB — COMPLETE METABOLIC PANEL WITH GFR
Albumin: 3.7 g/dL (ref 3.5–5.2)
BUN: 13 mg/dL (ref 6–23)
CO2: 26 mEq/L (ref 19–32)
Calcium: 8.8 mg/dL (ref 8.4–10.5)
Chloride: 106 mEq/L (ref 96–112)
GFR, Est African American: >89 mL/min
GFR, Est Non African American: >89 mL/min
Glucose, Bld: 99 mg/dL (ref 70–99)
Potassium: 4 mEq/L (ref 3.5–5.3)

## 2013-08-20 LAB — LIPID PANEL: Cholesterol: 177 mg/dL (ref 0–200)

## 2013-09-20 LAB — HM PAP SMEAR: HM Pap smear: NEGATIVE

## 2013-10-07 ENCOUNTER — Ambulatory Visit: Payer: 59 | Admitting: Internal Medicine

## 2013-10-07 ENCOUNTER — Encounter: Payer: Self-pay | Admitting: Internal Medicine

## 2013-11-07 NOTE — L&D Delivery Note (Signed)
Delivery Note At 5:50 PM a viable and healthy female was delivered via  (Presentation: OA  ).  APGAR: , ; weight P.   Placenta status: delivered, intact .  Cord: 3VC  with the following complications: none.    Anesthesia: Epidural  Episiotomy: None Lacerations: 1st degree Suture Repair: 3.0 vicryl rapide Est. Blood Loss (mL): 400cc  Mom to postpartum.  Baby to Couplet care / Skin to Skin.  Bovard-Stuckert, Anita Alexander 07/10/2014, 6:21 PM  A+/BTL - B salpingectomy/ Tdap/RI/ Br  D/w pt and FOB r/b/a of B salpingectomy, voices understanding, wishes to proceed

## 2013-11-12 ENCOUNTER — Ambulatory Visit: Payer: 59 | Admitting: Internal Medicine

## 2013-12-30 LAB — OB RESULTS CONSOLE RUBELLA ANTIBODY, IGM: RUBELLA: IMMUNE

## 2013-12-30 LAB — OB RESULTS CONSOLE ABO/RH: RH Type: POSITIVE

## 2013-12-30 LAB — OB RESULTS CONSOLE ANTIBODY SCREEN: ANTIBODY SCREEN: NEGATIVE

## 2013-12-30 LAB — OB RESULTS CONSOLE HIV ANTIBODY (ROUTINE TESTING): HIV: NONREACTIVE

## 2013-12-30 LAB — OB RESULTS CONSOLE GC/CHLAMYDIA
CHLAMYDIA, DNA PROBE: NEGATIVE
GC PROBE AMP, GENITAL: NEGATIVE

## 2013-12-30 LAB — OB RESULTS CONSOLE RPR: RPR: NONREACTIVE

## 2013-12-30 LAB — OB RESULTS CONSOLE HEPATITIS B SURFACE ANTIGEN: HEP B S AG: NEGATIVE

## 2014-04-15 LAB — HIV ANTIBODY: HIV 1/2 ANTIBODIES: NEGATIVE

## 2014-04-16 LAB — RPR, QUANT. (REFLEX): RAPID PLASMA REAGIN, QUANT: NEGATIVE

## 2014-05-09 ENCOUNTER — Encounter (HOSPITAL_COMMUNITY): Payer: Self-pay | Admitting: *Deleted

## 2014-05-09 ENCOUNTER — Inpatient Hospital Stay (HOSPITAL_COMMUNITY)
Admission: AD | Admit: 2014-05-09 | Discharge: 2014-05-09 | Disposition: A | Discharge status: Home / Self Care | Payer: 59 | Source: Ambulatory Visit | Attending: Obstetrics and Gynecology | Admitting: Obstetrics and Gynecology

## 2014-05-09 DIAGNOSIS — O4700 False labor before 37 completed weeks of gestation, unspecified trimester: Secondary | ICD-10-CM | POA: Insufficient documentation

## 2014-05-09 DIAGNOSIS — K219 Gastro-esophageal reflux disease without esophagitis: Secondary | ICD-10-CM | POA: Insufficient documentation

## 2014-05-09 DIAGNOSIS — O479 False labor, unspecified: Secondary | ICD-10-CM

## 2014-05-09 DIAGNOSIS — Z87891 Personal history of nicotine dependence: Secondary | ICD-10-CM | POA: Insufficient documentation

## 2014-05-09 LAB — URINALYSIS, ROUTINE W REFLEX MICROSCOPIC
BILIRUBIN URINE: NEGATIVE
Glucose, UA: NEGATIVE mg/dL
HGB URINE DIPSTICK: NEGATIVE
Ketones, ur: NEGATIVE mg/dL
Leukocytes, UA: NEGATIVE
Nitrite: NEGATIVE
PH: 6 (ref 5.0–8.0)
Protein, ur: NEGATIVE mg/dL
UROBILINOGEN UA: 0.2 mg/dL (ref 0.0–1.0)

## 2014-05-09 NOTE — MAU Note (Signed)
Pt having 6-7 contractions every hr.  Denies bleeding or discharge.

## 2014-05-09 NOTE — MAU Provider Note (Signed)
Chief Complaint:  Contractions   First Provider Initiated Contact with Patient 05/09/14 1202      HPI: Anita Alexander is a 43 y.o. [id number] at 17w3dwho presents to maternity admissions reporting contractions and back pain with onset this morning when she woke up.  The cramping was persistent all morning so she called the office and was told to come in to be evaluated.  By her arrival in MAU, she reports the contractions are gone and she feels much better.  She reports good fetal movement, denies LOF, vaginal bleeding, vaginal itching/burning, urinary symptoms, h/a, dizziness, n/v, or fever/chills.    Past Medical History: Past Medical History  Diagnosis Date  . SVD (spontaneous vaginal delivery)     x 2  . MVP (mitral valve prolapse)     no problems, no prior treatment for dental/surgical per pt  . Anxiety     no meds currently  . Depression     no meds currently  . GERD (gastroesophageal reflux disease)     Past obstetric history: OB History  Gravida Para Term Preterm AB SAB TAB Ectopic Multiple Living  7 2 2  4 4    2     # Outcome Date GA Lbr Len/2nd Weight Sex Delivery Anes PTL Lv  7 CUR           6 TRM           5 TRM           4 SAB           3 SAB           2 SAB           1 SAB               Past Surgical History: Past Surgical History  Procedure Laterality Date  . Dilation and curettage of uterus      mab x 1    Family History: History reviewed. No pertinent family history.  Social History: History  Substance Use Topics  . Smoking status: Former Smoker -- 1.00 packs/day for 7 years    Types: Cigarettes    Quit date: 11/07/1998  . Smokeless tobacco: Not on file  . Alcohol Use: Yes     Comment: daily    Allergies: No Known Allergies  Meds:  No prescriptions prior to admission    ROS: Pertinent findings in history of present illness.  Physical Exam  Blood pressure 124/74, pulse 78, temperature 98.2 F (36.8 C), temperature source Oral, resp.  rate 16, height 5\' 5"  (1.651 m), weight 84.369 kg (186 lb), last menstrual period 10/09/2013. GENERAL: Well-developed, well-nourished female in no acute distress.  HEENT: normocephalic HEART: normal rate RESP: normal effort ABDOMEN: Soft, non-tender, gravid appropriate for gestational age EXTREMITIES: Nontender, no edema NEURO: alert and oriented  Dilation: Closed Effacement (%): Thick Cervical Position: Posterior Exam by:: LLeftwich-Kirby, CNM  FHT:  Baseline 135 , moderate variability, accelerations present, no decelerations Contractions: None on toco or to palpation   Labs: Results for orders placed during the hospital encounter of 05/09/14 (from the past 24 hour(s))  URINALYSIS, ROUTINE W REFLEX MICROSCOPIC     Status: Abnormal   Collection Time    05/09/14 10:35 AM      Result Value Ref Range   Color, Urine YELLOW  YELLOW   APPearance CLEAR  CLEAR   Specific Gravity, Urine <1.005 (*) 1.005 - 1.030   pH 6.0  5.0 - 8.0  Glucose, UA NEGATIVE  NEGATIVE mg/dL   Hgb urine dipstick NEGATIVE  NEGATIVE   Bilirubin Urine NEGATIVE  NEGATIVE   Ketones, ur NEGATIVE  NEGATIVE mg/dL   Protein, ur NEGATIVE  NEGATIVE mg/dL   Urobilinogen, UA 0.2  0.0 - 1.0 mg/dL   Nitrite NEGATIVE  NEGATIVE   Leukocytes, UA NEGATIVE  NEGATIVE     Assessment: 1. [name] contractions     Plan: Consult Dr Willis Modena Discharge home PTL precautions and fetal kick counts F/U in office as scheduled Return to MAU as needed     Follow-up Information   Follow up with [facility]. (As scheduled)    Contact information:   [address]  [address] [place] [address] [phone]       Follow up with [facility]. (As needed for emergencies)    Contact information:   [id number] [place] [address] [phone]       Medication List         aspirin 81 MG chewable tablet  Chew 81 mg by mouth  daily.     buPROPion 300 MG 24 hr tablet  Commonly known as:  WELLBUTRIN XL  Take 1 tablet (300 mg total) by mouth daily.     busPIRone 15 MG tablet  Commonly known as:  BUSPAR  Take 1 tablet (15 mg total) by mouth 2 (two) times daily.     pantoprazole 40 MG tablet  Commonly known as:  PROTONIX  Take 1 tablet (40 mg total) by mouth daily.     prenatal multivitamin Tabs tablet  Take 1 tablet by mouth daily at 12 noon.     UNISOM PO  Take 12.5 mg by mouth at bedtime.        Fatima Blank Certified Nurse-Midwife 05/09/2014 3:08 PM

## 2014-05-09 NOTE — Discharge Instructions (Signed)

## 2014-05-09 NOTE — MAU Note (Signed)
Pt states ctx's began at 0700 this am. Denies bleeding or vag d.c changes. Has had [place] ctx's however these are on the verge of being painful per pt. Pain worse with movement.

## 2014-05-10 NOTE — Progress Notes (Signed)
FHT from 7-3 reviewed.  Reactive NST, occ ctx.

## 2014-05-31 ENCOUNTER — Encounter: Payer: Self-pay | Admitting: Emergency Medicine

## 2014-05-31 ENCOUNTER — Emergency Department
Admission: EM | Admit: 2014-05-31 | Discharge: 2014-05-31 | Disposition: A | Discharge status: Home / Self Care | Payer: 59 | Source: Home / Self Care | Attending: Emergency Medicine | Admitting: Emergency Medicine

## 2014-05-31 DIAGNOSIS — J209 Acute bronchitis, unspecified: Secondary | ICD-10-CM

## 2014-05-31 MED ORDER — AZITHROMYCIN 250 MG PO TABS: ORAL_TABLET | ORAL | Status: DC

## 2014-05-31 NOTE — ED Notes (Signed)
Pt has had sinus pain and pressure with cough for 1 wk.  Has tried robitussin and sudafed with no relief. Cough is productive and green.  Pt is [redacted] weeks pregnant.

## 2014-05-31 NOTE — ED Provider Notes (Signed)
CSN: [id number]     Arrival date & time 05/31/14  9211 History   First MD Initiated Contact with Patient 05/31/14 0940     Chief Complaint  Patient presents with  . Sinusitis   (Consider location/radiation/quality/duration/timing/severity/associated sxs/prior Treatment) HPI URI HISTORY  Anita Alexander is a 43 y.o. female who complains of onset of cough and cold symptoms for 7 days. She is [redacted] weeks pregnant . Has tried robitussin and sudafed (advised by her Ob) with mild relief.   No chills/sweats +  Low-grade Fever  +  Nasal congestion +  Mild Discolored Post-nasal drainage Mild sinus pain/pressure Mild sore throat  +  Cough, occasionally productive yellow sputum No wheezing Mild chest congestion No hemoptysis No shortness of breath No pleuritic pain  No itchy/red eyes No earache  No nausea No vomiting No abdominal pain No diarrhea  No skin rashes +  Fatigue No myalgias No headache   Past Medical History  Diagnosis Date  . SVD (spontaneous vaginal delivery)     x 2  . MVP (mitral valve prolapse)     no problems, no prior treatment for dental/surgical per pt  . Anxiety     no meds currently  . Depression     no meds currently  . GERD (gastroesophageal reflux disease)    Past Surgical History  Procedure Laterality Date  . Dilation and curettage of uterus      mab x 1   History reviewed. No pertinent family history. History  Substance Use Topics  . Smoking status: Former Smoker -- 1.00 packs/day for 7 years    Types: Cigarettes    Quit date: 11/07/1998  . Smokeless tobacco: Not on file  . Alcohol Use: Yes     Comment: daily   OB History   Grav Para Term Preterm Abortions TAB SAB Ect Mult Living   7 2 2  4  4   2      Review of Systems  All other systems reviewed and are negative.   Allergies  Review of patient's allergies indicates no known allergies.  Home Medications   Prior to Admission medications   Medication Sig Start Date End Date Taking?  Authorizing Provider  aspirin 81 MG chewable tablet Chew 81 mg by mouth daily.    Historical Provider, MD  azithromycin (ZITHROMAX Z-PAK) 250 MG tablet Take 2 tablets on day one, then 1 tablet daily on days 2 through 5 05/31/14   Jacqulyn Cane, MD  buPROPion (WELLBUTRIN XL) 300 MG 24 hr tablet Take 1 tablet (300 mg total) by mouth daily. 08/19/13   Jade L Breeback, PA-C  busPIRone (BUSPAR) 15 MG tablet Take 1 tablet (15 mg total) by mouth 2 (two) times daily. 08/19/13   Jade L Breeback, PA-C  Doxylamine Succinate, Sleep, (UNISOM PO) Take 12.5 mg by mouth at bedtime.    Historical Provider, MD  pantoprazole (PROTONIX) 40 MG tablet Take 1 tablet (40 mg total) by mouth daily. 08/19/13 08/19/14  Donella Stade, PA-C  Prenatal Vit-Fe Fumarate-FA (PRENATAL MULTIVITAMIN) TABS tablet Take 1 tablet by mouth daily at 12 noon.    Historical Provider, MD   BP 103/72  Pulse 112  Temp(Src) 98.2 F (36.8 C) (Oral)  Ht 5\' 5"  (1.651 m)  Wt 187 lb (84.823 kg)  BMI 31.12 kg/m2  SpO2 96%  LMP 10/09/2013 Physical Exam  Nursing note and vitals reviewed. Constitutional: She is oriented to person, place, and time. She appears well-developed and well-nourished. No distress.  HENT:  Head: Normocephalic and atraumatic.  Right Ear: Tympanic membrane normal.  Left Ear: Tympanic membrane normal.  Mouth/Throat: Oropharynx is clear and moist. No oropharyngeal exudate.  Nose with mildly boggy turbinates and mild seromucoid drainage.  Eyes: Right eye exhibits no discharge. Left eye exhibits no discharge. No scleral icterus.  Neck: Neck supple.  Cardiovascular: Normal rate, regular rhythm and normal heart sounds.   Pulmonary/Chest: No respiratory distress. She has no wheezes. She has rhonchi. She has no rales.  Lymphadenopathy:    She has no cervical adenopathy.  Neurological: She is alert and oriented to person, place, and time.  Skin: Skin is warm and dry.   O2 saturation 96% on room air ED Course  Procedures  (including critical care time) Labs Review Labs Reviewed - No data to display  Imaging Review No results found.   MDM   1. Acute bronchitis, unspecified organism    Treatment options discussed, as well as risks, benefits, alternatives. Patient voiced understanding and agreement with the following plans: Zithromax Z-PAK. Other symptomatic care discussed, as advised by her OB.  Follow-up with your primary care doctor in 5-7 days if not improving, or sooner if symptoms become worse. Precautions discussed. Red flags discussed. Questions invited and answered. Patient voiced understanding and agreement.     Jacqulyn Cane, MD 05/31/14 [phone]

## 2014-06-02 ENCOUNTER — Ambulatory Visit (INDEPENDENT_AMBULATORY_CARE_PROVIDER_SITE_OTHER): Payer: 59 | Admitting: Physician Assistant

## 2014-06-02 ENCOUNTER — Encounter: Payer: Self-pay | Admitting: Physician Assistant

## 2014-06-02 VITALS — BP 135/88 | HR 110 | Ht 65.0 in | Wt 188.0 lb

## 2014-06-02 DIAGNOSIS — H1132 Conjunctival hemorrhage, left eye: Secondary | ICD-10-CM

## 2014-06-02 DIAGNOSIS — Z3490 Encounter for supervision of normal pregnancy, unspecified, unspecified trimester: Secondary | ICD-10-CM

## 2014-06-02 DIAGNOSIS — Z331 Pregnant state, incidental: Secondary | ICD-10-CM

## 2014-06-02 DIAGNOSIS — R05 Cough: Secondary | ICD-10-CM

## 2014-06-02 DIAGNOSIS — R079 Chest pain, unspecified: Secondary | ICD-10-CM

## 2014-06-02 DIAGNOSIS — J209 Acute bronchitis, unspecified: Secondary | ICD-10-CM

## 2014-06-02 DIAGNOSIS — R059 Cough, unspecified: Secondary | ICD-10-CM

## 2014-06-02 DIAGNOSIS — H1130 Conjunctival hemorrhage, unspecified eye: Secondary | ICD-10-CM

## 2014-06-02 DIAGNOSIS — R0781 Pleurodynia: Secondary | ICD-10-CM

## 2014-06-02 DIAGNOSIS — M940 Chondrocostal junction syndrome [Tietze]: Secondary | ICD-10-CM

## 2014-06-02 MED ORDER — GUAIFENESIN-CODEINE 100-10 MG/5ML PO SOLN: ORAL | Status: DC

## 2014-06-02 NOTE — Progress Notes (Signed)
   Subjective:    Patient ID: Anita Alexander, female    DOB: [birth year], 43 y.o.   MRN: [id number]  HPI Pt is 43 yo woman who is 33weeks 6 days pregnant who presents with bronchitis diagnosed by urgent care on 05/31/2014. Her cough continues to be violent and constant. She now has pain and tenderness over her left flank and ribs. She denies any urinary frequency, urgency or pain with urination. She has a subconjunctival hemorrhage of her left eye. She is on azithromycin for her third day. She denies any wheezing. She denies any fever. She feels very fatigued and more now. Patient is currently on robatussin/delsym for cough.    Review of Systems  All other systems reviewed and are negative.      Objective:   Physical Exam  Constitutional: She is oriented to person, place, and time. She appears well-developed and well-nourished.  HENT:  Head: Normocephalic and atraumatic.  Eyes:  Left lateral eye subconjunctival hemorrhage.   Cardiovascular: Regular rhythm and normal heart sounds.   Tachycardia at 110  Pulmonary/Chest: Effort normal and breath sounds normal.  Tenderness to palpation along left flank and down along the 11th left rib is most prominent.   Neurological: She is alert and oriented to person, place, and time.  Psychiatric: She has a normal mood and affect. Her behavior is normal.          Assessment & Plan:  Acute bronchitis/cough/left pain of rib/subconjunctival hemorrhage left eye/costochondritis- [facility] was called and spoke with Dr. Mariea Clonts concerning something acceptable to go for cough at 42 weeks. She suggested Robitussin with codeine 1-2 teaspoons every 4-6 hours as needed for cough.  This was given to patient. This should help with pain and cough. Discussed with patient I do think she likely has a left fracture rib with significant costochondritis. The eye hemorrhage is due to coughing as well. Suggested patient to rest, use a steam bath to help open up  airways, consider honey and tea. Follow up as needed or if symptoms worsening. Reviewed red flags and to get to ER.

## 2014-06-06 ENCOUNTER — Ambulatory Visit (INDEPENDENT_AMBULATORY_CARE_PROVIDER_SITE_OTHER): Payer: 59 | Admitting: Physician Assistant

## 2014-06-06 ENCOUNTER — Ambulatory Visit (INDEPENDENT_AMBULATORY_CARE_PROVIDER_SITE_OTHER): Payer: 59

## 2014-06-06 ENCOUNTER — Encounter: Payer: Self-pay | Admitting: Physician Assistant

## 2014-06-06 VITALS — BP 117/73 | HR 102 | Temp 98.0°F

## 2014-06-06 DIAGNOSIS — R079 Chest pain, unspecified: Secondary | ICD-10-CM

## 2014-06-06 DIAGNOSIS — R0989 Other specified symptoms and signs involving the circulatory and respiratory systems: Secondary | ICD-10-CM

## 2014-06-06 DIAGNOSIS — R059 Cough, unspecified: Secondary | ICD-10-CM

## 2014-06-06 DIAGNOSIS — M940 Chondrocostal junction syndrome [Tietze]: Secondary | ICD-10-CM

## 2014-06-06 DIAGNOSIS — R05 Cough: Secondary | ICD-10-CM

## 2014-06-06 MED ORDER — GUAIFENESIN-CODEINE 100-10 MG/5ML PO SOLN: ORAL | Status: DC

## 2014-06-06 MED ORDER — HYDROCODONE-ACETAMINOPHEN 5-325 MG PO TABS: 1.0000 | ORAL_TABLET | Freq: Four times a day (QID) | ORAL | Status: DC | PRN

## 2014-06-06 NOTE — Progress Notes (Signed)
   Subjective:    Patient ID: Anita Alexander, female    DOB: 1971/09/11, 43 y.o.   MRN: [id number]  HPI  pt is a 43 yo female who is [redacted] weeks pregnant. She presents to the clinic with worsening left back and side pain with any deep breathing. She had acute bronchitis and was treated with zpak. She feels like the bronchitis is better but the coughs continues. She was given robatussin with codiene last visit for cough. It has helped some but any cough and deep breathing causes 10/10 pain in her left ribs, side and back. She admits she heard a "pop" when she called the other day. Pain is with any movement, breathing, cough. Denies any urinary symptoms. No fever,chills, nausea. She does feel SOB but she feels like due to pain. No wheezing.    Review of Systems  All other systems reviewed and are negative.      Objective:   Physical Exam  Constitutional: She is oriented to person, place, and time. She appears well-developed and well-nourished.  HENT:  Head: Normocephalic and atraumatic.  Eyes:  Left subconjuntia hematoma.   Cardiovascular: Regular rhythm and normal heart sounds.   Tachycardia at 102.   Pulmonary/Chest:  Decreased effort due to pain. Left side lung crackles. No wheezing.   10/10 tenderness to palpation over lower left ribs, left mid back.   Neurological: She is alert and oriented to person, place, and time.  Skin: Skin is dry.  Psychiatric: She has a normal mood and affect. Her behavior is normal.          Assessment & Plan:  Costochondritis/cough/lung crackles- call GYN. Approved chest xray. Vital signs reassuring. Pulse ox great. BP still controlled. No fever. vicodin given for pain and cough. Refilled robotussin with codiene for cough and to use in between vicodin for pain. Stay away from NSAIDs. Chest xray was unremarkable for pneumonia or fractured rib. Did not give the view that would have looked at lower ribs. Still possible to have fractured rib. At this point  HO was given for costrochondritis and will try to treat pain and cough. Warm compresses. Pt will follow up with OB on Monday.   FINDINGS: Normal heart size, mediastinal contours, and pulmonary vascularity.  Lungs clear.  No pleural effusion or pneumothorax.  Bones unremarkable.  IMPRESSION: No acute abnormalities.

## 2014-06-17 LAB — OB RESULTS CONSOLE GBS: STREP GROUP B AG: NEGATIVE

## 2014-07-01 ENCOUNTER — Encounter (HOSPITAL_COMMUNITY): Payer: Self-pay | Admitting: *Deleted

## 2014-07-01 ENCOUNTER — Telehealth (HOSPITAL_COMMUNITY): Payer: Self-pay | Admitting: *Deleted

## 2014-07-01 NOTE — Telephone Encounter (Signed)
Preadmission screen  

## 2014-07-04 ENCOUNTER — Inpatient Hospital Stay (HOSPITAL_COMMUNITY)
Admission: AD | Admit: 2014-07-04 | Discharge: 2014-07-04 | Disposition: A | Discharge status: Home / Self Care | Payer: 59 | Source: Ambulatory Visit | Attending: Obstetrics and Gynecology | Admitting: Obstetrics and Gynecology

## 2014-07-04 ENCOUNTER — Encounter (HOSPITAL_COMMUNITY): Payer: Self-pay | Admitting: *Deleted

## 2014-07-04 DIAGNOSIS — O09529 Supervision of elderly multigravida, unspecified trimester: Secondary | ICD-10-CM | POA: Insufficient documentation

## 2014-07-04 DIAGNOSIS — O99891 Other specified diseases and conditions complicating pregnancy: Secondary | ICD-10-CM | POA: Insufficient documentation

## 2014-07-04 DIAGNOSIS — O9989 Other specified diseases and conditions complicating pregnancy, childbirth and the puerperium: Principal | ICD-10-CM

## 2014-07-04 NOTE — MAU Provider Note (Signed)
S: 43 y.o.  [id number] @[redacted]w[redacted]d  presents to MAU with an increase in fetal activity today since her membranes were stripped in the office.  She usually feels lots of fetal movement but it is more in the afternoon.  This morning, after her office visit, the baby was moving much more than usual for several hours, then the movement decreased.  She talked with family members and then became worried and wanted to get things checked out.  She reports fetal movement in MAU, denies regular contractions, LOF, vaginal bleeding, vaginal itching/burning, urinary symptoms, h/a, dizziness, n/v, or fever/chills.    O: BP 128/82  Pulse 100  Temp(Src) 98.6 F (37 C) (Oral)  Resp 18  Ht 5\' 5"  (1.651 m)  Wt 87.363 kg (192 lb 9.6 oz)  BMI 32.05 kg/m2  LMP 10/09/2013 Physical Examination: General appearance - alert, well appearing, and in no distress, oriented to person, place, and time and acyanotic, in no respiratory distress  FHR baseline 130 with moderate variability, positive accels, negative decels No contractions on toco or to palpation  A: 43 y.o. [id number] @[redacted]w[redacted]d  with concern about pregnancy because of increase in fetal movement today No s/sx of labor Category I FHR tracing with accelerations  P: Medical screening performed RN to call attending  Fatima Blank Certified Nurse-Midwife

## 2014-07-04 NOTE — Treatment Plan (Signed)
Dr Melba Coon notified of reactive and reassuring NST, patient denies contractions May d/c home with labor precautions

## 2014-07-04 NOTE — MAU Note (Signed)
Seen in office today, membranes stripped.  Has had an increase in fetal movement that made her a little nervous wanted to be sure baby not in distress

## 2014-07-07 NOTE — MAU Provider Note (Signed)
Pt to MAU for increased fetal movement 130's R, category 1 NST

## 2014-07-09 ENCOUNTER — Encounter (HOSPITAL_COMMUNITY): Payer: Self-pay

## 2014-07-09 DIAGNOSIS — IMO0002 Reserved for concepts with insufficient information to code with codable children: Secondary | ICD-10-CM | POA: Diagnosis present

## 2014-07-09 NOTE — H&P (Signed)
Anita Alexander is a 43 y.o. female [id number] at 39+ for IOL, given term and favorable cervix.  Relatively uncomplicated PNC, pregnancy is from IVF - FET.  Also mother is AMA.  Poor OB histroy.  +FM, no LOF, no VB, occ ctx. D/W pt plan for IOL and plan of care.   Maternal Medical History:  Contractions: Frequency: irregular.    Fetal activity: Perceived fetal activity is normal.    Prenatal Complications - Diabetes: none.    OB History   Grav Para Term Preterm Abortions TAB SAB Ect Mult Living   7 2 2  4  4   2     G1 SVD female 7#14 G2 and 3 SAB G4 SVD female 10#4 G5 and 6 SAB G7 present - IVF, FET ([facility])   No abn pap No STD  Past Medical History  Diagnosis Date  . SVD (spontaneous vaginal delivery)     x 2  . MVP (mitral valve prolapse)     no problems, no prior treatment for dental/surgical per pt  . Anxiety     no meds currently  . Depression     no meds currently  . GERD (gastroesophageal reflux disease)   . Headache(784.0)     migraine  . MVP (mitral valve prolapse)   . Advanced maternal age (AMA), 54 years or greater 07/09/2014   Past Surgical History  Procedure Laterality Date  . Dilation and curettage of uterus      mab x 1  . Colonoscopy    . Finger surgery      cyst removed  . Wisdom tooth extraction     Family History: anxiety,CAD, Depression, anxiety, brain CA, HTN, lung CA Social History:  reports that she quit smoking about 15 years ago. Her smoking use included Cigarettes. She has a 7 pack-year smoking history. She does not have any smokeless tobacco history on file. She reports that she does not drink alcohol or use illicit drugs.married, SAHM Meds PNV, Protonix All NKDA   Prenatal Transfer Tool  Maternal Diabetes: No Genetic Screening: Normal Maternal Ultrasounds/Referrals: Normal Fetal Ultrasounds or other Referrals:  None Maternal Substance Abuse:  No Significant Maternal Medications:  None Significant Maternal Lab Results:  Lab values  include: Group B Strep negative Other Comments:  Panorama - low risk, female  Review of Systems  Constitutional: Negative.   HENT: Negative.   Eyes: Negative.   Respiratory: Negative.   Cardiovascular: Negative.   Gastrointestinal: Negative.   Genitourinary: Negative.   Musculoskeletal: Negative.   Skin: Negative.   Neurological: Negative.   Psychiatric/Behavioral: Negative.       Last menstrual period 10/09/2013. Maternal Exam:  Uterine Assessment: Contraction frequency is irregular.   Abdomen: Fundal height is appropriate foe gestation.   Estimated fetal weight is 9-10#.   Fetal presentation: vertex  Introitus: Normal vulva. Normal vagina.  Pelvis: adequate for delivery.   Cervix: Cervix evaluated by digital exam.     Physical Exam  Constitutional: She is oriented to person, place, and time. She appears well-developed and well-nourished.  HENT:  Head: Normocephalic and atraumatic.  Cardiovascular: Normal rate and regular rhythm.   Respiratory: Effort normal and breath sounds normal. No respiratory distress. She has no wheezes.  GI: Soft. Bowel sounds are normal. She exhibits no distension. There is no tenderness.  Musculoskeletal: Normal range of motion.  Neurological: She is alert and oriented to person, place, and time.  Skin: Skin is warm and dry.  Psychiatric: She has a  normal mood and affect. Her behavior is normal.    Prenatal labs: ABO, Rh: A/Positive/-- (02/23 0000) Antibody: Negative (02/23 0000) Rubella: Immune (02/23 0000) RPR: Nonreactive (02/23 0000)  HBsAg: Negative (02/23 0000)  HIV: Non-reactive (02/23 0000)  GBS:   negative  Hgb 13.8/Ur Cx neg/GC neg/ Chl neg/Panorama low risk, female/ AFP wnl/ nl 3hr GTT/CF neg  Nl anat, post fundal plac, female, previa resolved Tdap 04/11/14  Assessment/Plan: 93AT [id number] at 39+ for IOL gbbs neg AROM and pitocin for IOL Expect SVD   Bovard-Stuckert, Daleyza Gadomski 07/09/2014, 10:09 PM

## 2014-07-10 ENCOUNTER — Encounter (HOSPITAL_COMMUNITY): Payer: 59 | Admitting: Anesthesiology

## 2014-07-10 ENCOUNTER — Inpatient Hospital Stay (HOSPITAL_COMMUNITY): Payer: 59 | Admitting: Anesthesiology

## 2014-07-10 ENCOUNTER — Encounter (HOSPITAL_COMMUNITY): Payer: Self-pay

## 2014-07-10 ENCOUNTER — Encounter (HOSPITAL_COMMUNITY)
Admission: RE | Disposition: A | Discharge status: Home / Self Care | Payer: Self-pay | Source: Ambulatory Visit | Attending: Obstetrics and Gynecology

## 2014-07-10 ENCOUNTER — Inpatient Hospital Stay (HOSPITAL_COMMUNITY)
Admission: RE | Admit: 2014-07-10 | Discharge: 2014-07-11 | DRG: 767 | Disposition: A | Discharge status: Home / Self Care | Payer: 59 | Source: Ambulatory Visit | Attending: Obstetrics and Gynecology | Admitting: Obstetrics and Gynecology

## 2014-07-10 VITALS — BP 114/59 | HR 83 | Temp 97.3°F | Resp 18 | Ht 65.0 in | Wt 192.0 lb

## 2014-07-10 DIAGNOSIS — Z9851 Tubal ligation status: Secondary | ICD-10-CM

## 2014-07-10 DIAGNOSIS — F341 Dysthymic disorder: Secondary | ICD-10-CM | POA: Diagnosis present

## 2014-07-10 DIAGNOSIS — Z808 Family history of malignant neoplasm of other organs or systems: Secondary | ICD-10-CM

## 2014-07-10 DIAGNOSIS — O9942 Diseases of the circulatory system complicating childbirth: Secondary | ICD-10-CM

## 2014-07-10 DIAGNOSIS — O99891 Other specified diseases and conditions complicating pregnancy: Secondary | ICD-10-CM | POA: Diagnosis present

## 2014-07-10 DIAGNOSIS — I2510 Atherosclerotic heart disease of native coronary artery without angina pectoris: Secondary | ICD-10-CM | POA: Diagnosis present

## 2014-07-10 DIAGNOSIS — I059 Rheumatic mitral valve disease, unspecified: Secondary | ICD-10-CM | POA: Diagnosis present

## 2014-07-10 DIAGNOSIS — Z87891 Personal history of nicotine dependence: Secondary | ICD-10-CM | POA: Diagnosis not present

## 2014-07-10 DIAGNOSIS — Z8249 Family history of ischemic heart disease and other diseases of the circulatory system: Secondary | ICD-10-CM

## 2014-07-10 DIAGNOSIS — Z302 Encounter for sterilization: Secondary | ICD-10-CM

## 2014-07-10 DIAGNOSIS — Z3480 Encounter for supervision of other normal pregnancy, unspecified trimester: Secondary | ICD-10-CM

## 2014-07-10 DIAGNOSIS — Z801 Family history of malignant neoplasm of trachea, bronchus and lung: Secondary | ICD-10-CM

## 2014-07-10 DIAGNOSIS — D649 Anemia, unspecified: Secondary | ICD-10-CM | POA: Diagnosis present

## 2014-07-10 DIAGNOSIS — O09529 Supervision of elderly multigravida, unspecified trimester: Secondary | ICD-10-CM | POA: Diagnosis present

## 2014-07-10 DIAGNOSIS — K219 Gastro-esophageal reflux disease without esophagitis: Secondary | ICD-10-CM | POA: Diagnosis present

## 2014-07-10 DIAGNOSIS — IMO0002 Reserved for concepts with insufficient information to code with codable children: Secondary | ICD-10-CM

## 2014-07-10 DIAGNOSIS — O99344 Other mental disorders complicating childbirth: Secondary | ICD-10-CM | POA: Diagnosis present

## 2014-07-10 DIAGNOSIS — O9902 Anemia complicating childbirth: Secondary | ICD-10-CM | POA: Diagnosis present

## 2014-07-10 HISTORY — PX: TUBAL LIGATION: SHX77

## 2014-07-10 LAB — CBC
HEMATOCRIT: 30.6 % — AB (ref 36.0–46.0)
Hemoglobin: 10 g/dL — ABNORMAL LOW (ref 12.0–15.0)
MCH: 27.8 pg (ref 26.0–34.0)
MCHC: 32.7 g/dL (ref 30.0–36.0)
MCV: 85 fL (ref 78.0–100.0)
Platelets: 259 10*3/uL (ref 150–400)
RBC: 3.6 MIL/uL — ABNORMAL LOW (ref 3.87–5.11)
RDW: 15.1 % (ref 11.5–15.5)
WBC: 9.8 10*3/uL (ref 4.0–10.5)

## 2014-07-10 LAB — RPR

## 2014-07-10 SURGERY — LIGATION, FALLOPIAN TUBE, POSTPARTUM
Anesthesia: Epidural | Site: Abdomen | Laterality: Bilateral

## 2014-07-10 MED ORDER — EPHEDRINE 5 MG/ML INJ
10.0000 mg | INTRAVENOUS | Status: DC | PRN
  Filled 2014-07-10 (×2): qty 4

## 2014-07-10 MED ORDER — LIDOCAINE HCL (PF) 1 % IJ SOLN
30.0000 mL | INTRAMUSCULAR | Status: DC | PRN
  Filled 2014-07-10: qty 30

## 2014-07-10 MED ORDER — DEXAMETHASONE SODIUM PHOSPHATE 4 MG/ML IJ SOLN
INTRAMUSCULAR | Status: AC
  Filled 2014-07-10: qty 1

## 2014-07-10 MED ORDER — DIPHENHYDRAMINE HCL 50 MG/ML IJ SOLN: 12.5000 mg | INTRAMUSCULAR | Status: DC | PRN

## 2014-07-10 MED ORDER — OXYCODONE-ACETAMINOPHEN 5-325 MG PO TABS: 1.0000 | ORAL_TABLET | ORAL | Status: DC | PRN

## 2014-07-10 MED ORDER — MEPERIDINE HCL 25 MG/ML IJ SOLN: 6.2500 mg | INTRAMUSCULAR | Status: DC | PRN

## 2014-07-10 MED ORDER — OXYTOCIN BOLUS FROM INFUSION
500.0000 mL | INTRAVENOUS | Status: DC
  Administered 2014-07-10: 500 mL via INTRAVENOUS

## 2014-07-10 MED ORDER — 0.9 % SODIUM CHLORIDE (POUR BTL) OPTIME
TOPICAL | Status: DC | PRN
  Administered 2014-07-10: 1000 mL

## 2014-07-10 MED ORDER — OXYTOCIN 40 UNITS IN LACTATED RINGERS INFUSION - SIMPLE MED
62.5000 mL/h | INTRAVENOUS | Status: DC
  Administered 2014-07-10: 62.5 mL/h via INTRAVENOUS

## 2014-07-10 MED ORDER — FENTANYL CITRATE 0.05 MG/ML IJ SOLN
INTRAMUSCULAR | Status: AC
  Filled 2014-07-10: qty 2

## 2014-07-10 MED ORDER — TERBUTALINE SULFATE 1 MG/ML IJ SOLN
0.2500 mg | Freq: Once | INTRAMUSCULAR | Status: DC | PRN
Start: 2014-07-10 — End: 2014-07-10

## 2014-07-10 MED ORDER — OXYCODONE-ACETAMINOPHEN 5-325 MG PO TABS: 2.0000 | ORAL_TABLET | ORAL | Status: DC | PRN

## 2014-07-10 MED ORDER — LACTATED RINGERS IV SOLN: 500.0000 mL | INTRAVENOUS | Status: DC | PRN

## 2014-07-10 MED ORDER — LACTATED RINGERS IV SOLN
500.0000 mL | Freq: Once | INTRAVENOUS | Status: AC
  Administered 2014-07-10: 500 mL via INTRAVENOUS

## 2014-07-10 MED ORDER — FENTANYL CITRATE 0.05 MG/ML IJ SOLN
INTRAMUSCULAR | Status: DC | PRN
  Administered 2014-07-10 (×2): 50 ug via INTRAVENOUS
  Administered 2014-07-10 (×2): 25 ug via INTRAVENOUS

## 2014-07-10 MED ORDER — KETOROLAC TROMETHAMINE 30 MG/ML IJ SOLN
INTRAMUSCULAR | Status: DC | PRN
  Administered 2014-07-10: 30 mg via INTRAVENOUS

## 2014-07-10 MED ORDER — BUPIVACAINE HCL (PF) 0.25 % IJ SOLN
INTRAMUSCULAR | Status: DC | PRN
  Administered 2014-07-10: 10 mL

## 2014-07-10 MED ORDER — BUPIVACAINE HCL (PF) 0.25 % IJ SOLN
INTRAMUSCULAR | Status: AC
Start: 2014-07-10 — End: 2014-07-10
  Filled 2014-07-10: qty 30

## 2014-07-10 MED ORDER — ONDANSETRON HCL 4 MG/2ML IJ SOLN
INTRAMUSCULAR | Status: DC | PRN
  Administered 2014-07-10: 4 mg via INTRAVENOUS

## 2014-07-10 MED ORDER — FENTANYL 2.5 MCG/ML BUPIVACAINE 1/10 % EPIDURAL INFUSION (WH - ANES)
INTRAMUSCULAR | Status: DC | PRN
  Administered 2014-07-10: 14 mL/h via EPIDURAL

## 2014-07-10 MED ORDER — LACTATED RINGERS IV SOLN: INTRAVENOUS | Status: DC

## 2014-07-10 MED ORDER — MIDAZOLAM HCL 2 MG/2ML IJ SOLN: 0.5000 mg | Freq: Once | INTRAMUSCULAR | Status: AC | PRN

## 2014-07-10 MED ORDER — EPHEDRINE 5 MG/ML INJ
10.0000 mg | INTRAVENOUS | Status: DC | PRN
  Administered 2014-07-10: 10 mg via INTRAVENOUS

## 2014-07-10 MED ORDER — OXYTOCIN 40 UNITS IN LACTATED RINGERS INFUSION - SIMPLE MED
1.0000 m[IU]/min | INTRAVENOUS | Status: DC
  Administered 2014-07-10: 2 m[IU]/min via INTRAVENOUS
  Filled 2014-07-10: qty 1000

## 2014-07-10 MED ORDER — SCOPOLAMINE 1 MG/3DAYS TD PT72
MEDICATED_PATCH | TRANSDERMAL | Status: DC | PRN
  Administered 2014-07-10: 1 via TRANSDERMAL

## 2014-07-10 MED ORDER — FENTANYL 2.5 MCG/ML BUPIVACAINE 1/10 % EPIDURAL INFUSION (WH - ANES)
14.0000 mL/h | INTRAMUSCULAR | Status: DC | PRN
Start: 2014-07-10 — End: 2014-07-10
  Administered 2014-07-10 (×2): 14 mL/h via EPIDURAL
  Filled 2014-07-10 (×2): qty 125

## 2014-07-10 MED ORDER — LIDOCAINE-EPINEPHRINE (PF) 2 %-1:200000 IJ SOLN
INTRAMUSCULAR | Status: AC
  Filled 2014-07-10: qty 20

## 2014-07-10 MED ORDER — KETOROLAC TROMETHAMINE 30 MG/ML IJ SOLN: 15.0000 mg | Freq: Once | INTRAMUSCULAR | Status: AC | PRN

## 2014-07-10 MED ORDER — FENTANYL CITRATE 0.05 MG/ML IJ SOLN
INTRAMUSCULAR | Status: AC
  Filled 2014-07-10: qty 5

## 2014-07-10 MED ORDER — LACTATED RINGERS IV SOLN
INTRAVENOUS | Status: DC
  Administered 2014-07-10 (×4): via INTRAVENOUS

## 2014-07-10 MED ORDER — SCOPOLAMINE 1 MG/3DAYS TD PT72
MEDICATED_PATCH | TRANSDERMAL | Status: AC
  Filled 2014-07-10: qty 1

## 2014-07-10 MED ORDER — FENTANYL CITRATE 0.05 MG/ML IJ SOLN: 25.0000 ug | INTRAMUSCULAR | Status: DC | PRN

## 2014-07-10 MED ORDER — PHENYLEPHRINE 40 MCG/ML (10ML) SYRINGE FOR IV PUSH (FOR BLOOD PRESSURE SUPPORT): 80.0000 ug | PREFILLED_SYRINGE | INTRAVENOUS | Status: DC | PRN

## 2014-07-10 MED ORDER — LIDOCAINE-EPINEPHRINE (PF) 2 %-1:200000 IJ SOLN: INTRAMUSCULAR | Status: DC | PRN

## 2014-07-10 MED ORDER — CITRIC ACID-SODIUM CITRATE 334-500 MG/5ML PO SOLN
30.0000 mL | ORAL | Status: DC | PRN
  Administered 2014-07-10: 30 mL via ORAL
  Filled 2014-07-10: qty 15

## 2014-07-10 MED ORDER — ONDANSETRON HCL 4 MG/2ML IJ SOLN
INTRAMUSCULAR | Status: AC
  Filled 2014-07-10: qty 2

## 2014-07-10 MED ORDER — PHENYLEPHRINE 40 MCG/ML (10ML) SYRINGE FOR IV PUSH (FOR BLOOD PRESSURE SUPPORT)
80.0000 ug | PREFILLED_SYRINGE | INTRAVENOUS | Status: DC | PRN
  Filled 2014-07-10: qty 10

## 2014-07-10 MED ORDER — SODIUM BICARBONATE 8.4 % IV SOLN
INTRAVENOUS | Status: DC | PRN
  Administered 2014-07-10 (×3): 5 mL via EPIDURAL

## 2014-07-10 MED ORDER — ACETAMINOPHEN 325 MG PO TABS: 650.0000 mg | ORAL_TABLET | ORAL | Status: DC | PRN

## 2014-07-10 MED ORDER — MIDAZOLAM HCL 5 MG/5ML IJ SOLN
INTRAMUSCULAR | Status: DC | PRN
  Administered 2014-07-10: 1 mg via INTRAVENOUS

## 2014-07-10 MED ORDER — MIDAZOLAM HCL 2 MG/2ML IJ SOLN
INTRAMUSCULAR | Status: AC
  Filled 2014-07-10: qty 2

## 2014-07-10 MED ORDER — ONDANSETRON HCL 4 MG/2ML IJ SOLN: 4.0000 mg | Freq: Four times a day (QID) | INTRAMUSCULAR | Status: DC | PRN

## 2014-07-10 MED ORDER — FENTANYL CITRATE 0.05 MG/ML IJ SOLN
25.0000 ug | INTRAMUSCULAR | Status: DC | PRN
  Administered 2014-07-10: 50 ug via INTRAVENOUS

## 2014-07-10 MED ORDER — DEXAMETHASONE SODIUM PHOSPHATE 4 MG/ML IJ SOLN
INTRAMUSCULAR | Status: DC | PRN
  Administered 2014-07-10: 4 mg via INTRAVENOUS

## 2014-07-10 MED ORDER — LIDOCAINE HCL (PF) 1 % IJ SOLN
INTRAMUSCULAR | Status: DC | PRN
  Administered 2014-07-10 (×2): 5 mL

## 2014-07-10 MED ORDER — SODIUM BICARBONATE 8.4 % IV SOLN
INTRAVENOUS | Status: AC
  Filled 2014-07-10: qty 50

## 2014-07-10 MED ORDER — PROMETHAZINE HCL 25 MG/ML IJ SOLN: 6.2500 mg | INTRAMUSCULAR | Status: DC | PRN

## 2014-07-10 SURGICAL SUPPLY — 26 items
APL SKNCLS STERI-STRIP NONHPOA (GAUZE/BANDAGES/DRESSINGS) ×1
BENZOIN TINCTURE PRP APPL 2/3 (GAUZE/BANDAGES/DRESSINGS) ×3 IMPLANT
BLADE 11 SAFETY STRL DISP (BLADE) ×3 IMPLANT
CHLORAPREP W/TINT 26ML (MISCELLANEOUS) ×3 IMPLANT
CLOSURE WOUND 1/4 X3 (GAUZE/BANDAGES/DRESSINGS) ×1
CLOTH BEACON ORANGE TIMEOUT ST (SAFETY) ×3 IMPLANT
CONTAINER PREFILL 10% NBF 15ML (MISCELLANEOUS) ×6 IMPLANT
DRSG OPSITE POSTOP 3X4 (GAUZE/BANDAGES/DRESSINGS) ×3 IMPLANT
ELECT REM PT RETURN 9FT ADLT (ELECTROSURGICAL) ×3
ELECTRODE REM PT RTRN 9FT ADLT (ELECTROSURGICAL) ×1 IMPLANT
GLOVE BIO SURGEON STRL SZ 6.5 (GLOVE) ×4 IMPLANT
GLOVE BIO SURGEONS STRL SZ 6.5 (GLOVE) ×2
GOWN STRL REUS W/TWL LRG LVL3 (GOWN DISPOSABLE) ×6 IMPLANT
NS IRRIG 1000ML POUR BTL (IV SOLUTION) ×3 IMPLANT
PACK ABDOMINAL MINOR (CUSTOM PROCEDURE TRAY) ×3 IMPLANT
PENCIL BUTTON HOLSTER BLD 10FT (ELECTRODE) IMPLANT
SLEEVE SCD COMPRESS KNEE MED (MISCELLANEOUS) ×3 IMPLANT
SPONGE LAP 4X18 X RAY DECT (DISPOSABLE) ×3 IMPLANT
STRIP CLOSURE SKIN 1/4X3 (GAUZE/BANDAGES/DRESSINGS) ×2 IMPLANT
SUT PLAIN 0 NONE (SUTURE) ×3 IMPLANT
SUT VIC AB 0 CT1 27 (SUTURE) ×3
SUT VIC AB 0 CT1 27XBRD ANBCTR (SUTURE) ×1 IMPLANT
SUT VIC AB 3-0 FS2 27 (SUTURE) ×3 IMPLANT
TOWEL OR 17X24 6PK STRL BLUE (TOWEL DISPOSABLE) ×6 IMPLANT
TRAY FOLEY CATH 14FR (SET/KITS/TRAYS/PACK) ×3 IMPLANT
WATER STERILE IRR 1000ML POUR (IV SOLUTION) IMPLANT

## 2014-07-10 NOTE — Progress Notes (Signed)
Patient ID: Anita Alexander, female   DOB: May 22, 1971, 43 y.o.   MRN: [id number]  Reviewed H&P, no changes  AFVSS gen NAD FHTs 150's R category 1 toco irr  SVE 3.4/50/-2  AROM for clear fluid, w/o problems  Expect SVD

## 2014-07-10 NOTE — Transfer of Care (Signed)
Immediate Anesthesia Transfer of Care Note  Patient: Anita Alexander  Procedure(s) Performed: Procedure(s): Post Partum Tubal Ligation, Bilateral Salpingectomy (Bilateral)  Patient Location: PACU  Anesthesia Type:Epidural  Level of Consciousness: awake, alert  and oriented  Airway & Oxygen Therapy: Patient Spontanous Breathing  Post-op Assessment: Report given to PACU RN and Post -op Vital signs reviewed and stable  Post vital signs: Reviewed and stable  Complications: No apparent anesthesia complications

## 2014-07-10 NOTE — Anesthesia Preprocedure Evaluation (Addendum)
Anesthesia Evaluation  Patient identified by MRN, date of birth, ID band Patient awake    Reviewed: Allergy & Precautions, H&P , Patient's Chart, lab work & pertinent test results  Airway Mallampati: II TM Distance: >3 FB Neck ROM: full    Dental   Pulmonary former smoker,  breath sounds clear to auscultation        Cardiovascular + Valvular Problems/Murmurs MVP Rhythm:regular Rate:Normal     Neuro/Psych  Headaches,    GI/Hepatic GERD-  ,  Endo/Other    Renal/GU      Musculoskeletal   Abdominal   Peds  Hematology  (+) anemia ,   Anesthesia Other Findings   Reproductive/Obstetrics                          Anesthesia Physical  Anesthesia Plan  ASA: II  Anesthesia Plan: Epidural   Post-op Pain Management:    Induction:   Airway Management Planned:   Additional Equipment:   Intra-op Plan:   Post-operative Plan:   Informed Consent: I have reviewed the patients History and Physical, chart, labs and discussed the procedure including the risks, benefits and alternatives for the proposed anesthesia with the patient or authorized representative who has indicated his/her understanding and acceptance.     Plan Discussed with:   Anesthesia Plan Comments:         Anesthesia Quick Evaluation                                  Anesthesia Evaluation  Patient identified by MRN, date of birth, ID band Patient awake    Reviewed: Allergy & Precautions, H&P , Patient's Chart, lab work & pertinent test results  Airway Mallampati: II TM Distance: >3 FB Neck ROM: full    Dental   Pulmonary former smoker,  breath sounds clear to auscultation        Cardiovascular + Valvular Problems/Murmurs MVP Rhythm:regular Rate:Normal     Neuro/Psych  Headaches,    GI/Hepatic GERD-  ,  Endo/Other    Renal/GU      Musculoskeletal   Abdominal   Peds  Hematology  (+) anemia  ,   Anesthesia Other Findings   Reproductive/Obstetrics (+) Pregnancy                           Anesthesia Physical Anesthesia Plan  ASA: II  Anesthesia Plan: Epidural   Post-op Pain Management:    Induction:   Airway Management Planned:   Additional Equipment:   Intra-op Plan:   Post-operative Plan:   Informed Consent: I have reviewed the patients History and Physical, chart, labs and discussed the procedure including the risks, benefits and alternatives for the proposed anesthesia with the patient or authorized representative who has indicated his/her understanding and acceptance.     Plan Discussed with:   Anesthesia Plan Comments:         Anesthesia Quick Evaluation

## 2014-07-10 NOTE — Op Note (Signed)
PreOp: undesired fertility PostOp: undesired fertility Procedure PP BTL by B salpingectomy Surgeon: Janyth Contes MD Assistant: none EBL <10cc, IVF 2633HL, uop 456YB Complication: none Pathology: B tubal segments  Procedure:  After informed consent was reviewed with patient and her husband, risks, benefits and alternatives of procedure, including, but not limited to: bleeding, infection and damage to surrounding organs.  She was taken to the OR and placed on a table in supine position.  Patients epidural was dosed and found to be adequate.  SCDs were applied.  Foley catheter was placed.  Prepped and draped in normal sterile fashion.  An approximately 2 cm horizontal infraumbilical incision was made.  The fascia was cleared with a hemostat.  The fascia was grasped and elevated with coker clamps.  The fascia was incised.  It was regrasped and the posterior sheath was incised.  Periotoneum was entered bluntly.  Using [employer] retractors the PP uterus was visualized.  The Left tube was easily identified and followed out to the fimbriated end with Singley forceps.  The tube was isolated with a Kelly clamp and excised.  Tube was doubly ligated with plain gut suture and found to be hemostatic.  Pt was tilted to her left, the left tube was easily identified, and followed out to the fimbriated end with Singley forceps,  Isolated with Kelly clamp and then excised.  It was doubly ligated with plain gut suture.  Noted to be hemostatic.  The fascia was reapproximated with 0 vicryl.  A deep suture of 3-0 vicryl was placed.  The skin was clo=sed in a subcuticular fashion with 3-0 vicryl.  Benzoin and steris were applied.  Sponge lap and needle counts were correct x 2.  Patient tolerated procedure well.

## 2014-07-10 NOTE — Anesthesia Postprocedure Evaluation (Signed)
  Anesthesia Post-op Note  Patient: Anita Alexander  Procedure(s) Performed: Procedure(s): Post Partum Tubal Ligation, Bilateral Salpingectomy (Bilateral)   Patient is awake, responsive, moving her legs, and has signs of resolution of her numbness. Pain and nausea are reasonably well controlled. Vital signs are stable and clinically acceptable. Oxygen saturation is clinically acceptable. There are no apparent anesthetic complications at this time. Patient is ready for discharge.

## 2014-07-10 NOTE — Progress Notes (Signed)
Patient ID: Anita Alexander, female   DOB: 1970-11-09, 43 y.o.   MRN: [id number]  Comfortable with epidural  AFVSS gen NAD FHTs 130-140 R, category 1 toco diff to trace q 2-3 min  IUPC placed w/o diff/comp  43yo G7 P2042 at 39+ for IOL Expect SVD

## 2014-07-10 NOTE — Progress Notes (Signed)
Patient ID: Anita Alexander, female   DOB: 04/12/1971, 43 y.o.   MRN: [id number]  Comfortable with epidural  AFVSS gen NAD FHTs 145 good var, category 1 toco Q 2-3 min  SVE 6.7/80/-1  D/w pt progress, slow, but getting there  43yo [id number] at 39+ for IOL Continue current mgmt

## 2014-07-10 NOTE — Anesthesia Procedure Notes (Signed)
Epidural Patient location during procedure: OB Start time: 07/10/2014 8:52 AM  Staffing Anesthesiologist: Rudean Curt Performed by: anesthesiologist   Preanesthetic Checklist Completed: patient identified, site marked, surgical consent, pre-op evaluation, timeout performed, IV checked, risks and benefits discussed and monitors and equipment checked  Epidural Patient position: sitting Prep: site prepped and draped and DuraPrep Patient monitoring: continuous pulse ox and blood pressure Approach: midline Location: L3-L4 Injection technique: LOR air  Needle:  Needle type: Tuohy  Needle gauge: 17 G Needle length: 9 cm and 9 Needle insertion depth: 5 cm cm Catheter type: closed end flexible Catheter size: 19 Gauge Catheter at skin depth: 10 cm Test dose: negative  Assessment Events: blood not aspirated, injection not painful, no injection resistance, negative IV test and no paresthesia  Additional Notes Patient identified.  Risk benefits discussed including failed block, incomplete pain control, headache, nerve damage, paralysis, blood pressure changes, nausea, vomiting, reactions to medication both toxic or allergic, and postpartum back pain.  Patient expressed understanding and wished to proceed.  All questions were answered.  Sterile technique used throughout procedure and epidural site dressed with sterile barrier dressing. No paresthesia or other complications noted.The patient did not experience any signs of intravascular injection such as tinnitus or metallic taste in mouth nor signs of intrathecal spread such as rapid motor block. Please see nursing notes for vital signs.

## 2014-07-10 NOTE — Brief Op Note (Signed)
07/10/2014  8:17 PM  PATIENT:  Anita Alexander  43 y.o. female  PRE-OPERATIVE DIAGNOSIS:  desires sterilization  POST-OPERATIVE DIAGNOSIS:  desires sterilization  PROCEDURE:  Procedure(s): Post Partum Tubal Ligation, Bilateral Salpingectomy (Bilateral)  SURGEON:  Surgeon(s) and Role:    * Brier Firebaugh Bovard-Stuckert, MD - Primary  ANESTHESIA:   local and epidural  EBL:  Total I/O In: 1100 [I.V.:1100] Out: 105 [Urine:100; Blood:5]  BLOOD ADMINISTERED:none  DRAINS: none and Urinary Catheter (Foley)   LOCAL MEDICATIONS USED:  MARCAINE    and Amount: 10 ml  SPECIMEN:  Source of Specimen:  B tubal segmenta  DISPOSITION OF SPECIMEN:  PATHOLOGY  COUNTS:  YES  TOURNIQUET:  * No tourniquets in log *  DICTATION: .Typed out  PLAN OF CARE: Admit to inpatient   PATIENT DISPOSITION:  PACU - hemodynamically stable.   Delay start of Pharmacological VTE agent (>24hrs) due to surgical blood loss or risk of bleeding: not applicable

## 2014-07-10 NOTE — Anesthesia Preprocedure Evaluation (Signed)
Anesthesia Evaluation  Patient identified by MRN, date of birth, ID band Patient awake    Reviewed: Allergy & Precautions, H&P , Patient's Chart, lab work & pertinent test results  Airway Mallampati: II TM Distance: >3 FB Neck ROM: full    Dental   Pulmonary former smoker,  breath sounds clear to auscultation        Cardiovascular + Valvular Problems/Murmurs MVP Rhythm:regular Rate:Normal     Neuro/Psych  Headaches,    GI/Hepatic GERD-  ,  Endo/Other    Renal/GU      Musculoskeletal   Abdominal   Peds  Hematology  (+) anemia ,   Anesthesia Other Findings   Reproductive/Obstetrics (+) Pregnancy                           Anesthesia Physical Anesthesia Plan  ASA: II  Anesthesia Plan: Epidural   Post-op Pain Management:    Induction:   Airway Management Planned:   Additional Equipment:   Intra-op Plan:   Post-operative Plan:   Informed Consent: I have reviewed the patients History and Physical, chart, labs and discussed the procedure including the risks, benefits and alternatives for the proposed anesthesia with the patient or authorized representative who has indicated his/her understanding and acceptance.     Plan Discussed with:   Anesthesia Plan Comments:         Anesthesia Quick Evaluation

## 2014-07-11 ENCOUNTER — Encounter (HOSPITAL_COMMUNITY): Payer: Self-pay | Admitting: Obstetrics and Gynecology

## 2014-07-11 LAB — CBC
HCT: 24.7 % — ABNORMAL LOW (ref 36.0–46.0)
Hemoglobin: 8.1 g/dL — ABNORMAL LOW (ref 12.0–15.0)
MCH: 28.3 pg (ref 26.0–34.0)
MCHC: 33.2 g/dL (ref 30.0–36.0)
MCV: 85.2 fL (ref 78.0–100.0)
PLATELETS: 291 10*3/uL (ref 150–400)
RBC: 2.9 MIL/uL — ABNORMAL LOW (ref 3.87–5.11)
RDW: 15.1 % (ref 11.5–15.5)
WBC: 18.7 10*3/uL — ABNORMAL HIGH (ref 4.0–10.5)

## 2014-07-11 LAB — EPSTEIN-BARR VIRUS VCA ANTIBODY PANEL
EBV EA IgG: <5 U/mL (ref <9.0)
EBV NA IgG: 99.4 U/mL — ABNORMAL HIGH (ref <18.0)
EBV VCA IGG: 557 U/mL — AB (ref <18.0)
EBV VCA IgM: <10 U/mL (ref <36.0)

## 2014-07-11 MED ORDER — LANOLIN HYDROUS EX OINT: TOPICAL_OINTMENT | CUTANEOUS | Status: DC | PRN

## 2014-07-11 MED ORDER — PRENATAL MULTIVITAMIN CH: 1.0000 | ORAL_TABLET | Freq: Every day | ORAL | Status: DC

## 2014-07-11 MED ORDER — ONDANSETRON HCL 4 MG/2ML IJ SOLN: 4.0000 mg | INTRAMUSCULAR | Status: DC | PRN

## 2014-07-11 MED ORDER — DIBUCAINE 1 % RE OINT: 1.0000 "application " | TOPICAL_OINTMENT | RECTAL | Status: DC | PRN

## 2014-07-11 MED ORDER — OXYCODONE-ACETAMINOPHEN 5-325 MG PO TABS
1.0000 | ORAL_TABLET | ORAL | Status: DC | PRN
  Administered 2014-07-11 (×3): 1 via ORAL
  Filled 2014-07-11 (×3): qty 1

## 2014-07-11 MED ORDER — BUSPIRONE HCL 15 MG PO TABS
15.0000 mg | ORAL_TABLET | Freq: Two times a day (BID) | ORAL | Status: DC
  Administered 2014-07-11: 15 mg via ORAL
  Filled 2014-07-11 (×4): qty 1

## 2014-07-11 MED ORDER — WITCH HAZEL-GLYCERIN EX PADS: 1.0000 "application " | MEDICATED_PAD | CUTANEOUS | Status: DC | PRN

## 2014-07-11 MED ORDER — ONDANSETRON HCL 4 MG PO TABS: 4.0000 mg | ORAL_TABLET | ORAL | Status: DC | PRN

## 2014-07-11 MED ORDER — SENNOSIDES-DOCUSATE SODIUM 8.6-50 MG PO TABS
2.0000 | ORAL_TABLET | ORAL | Status: DC
  Administered 2014-07-11: 2 via ORAL
  Filled 2014-07-11: qty 2

## 2014-07-11 MED ORDER — IBUPROFEN 600 MG PO TABS
600.0000 mg | ORAL_TABLET | Freq: Four times a day (QID) | ORAL | Status: DC
  Administered 2014-07-11 (×3): 600 mg via ORAL
  Filled 2014-07-11 (×3): qty 1

## 2014-07-11 MED ORDER — DIPHENHYDRAMINE HCL 25 MG PO CAPS: 25.0000 mg | ORAL_CAPSULE | Freq: Four times a day (QID) | ORAL | Status: DC | PRN

## 2014-07-11 MED ORDER — OXYCODONE-ACETAMINOPHEN 5-325 MG PO TABS: 1.0000 | ORAL_TABLET | Freq: Four times a day (QID) | ORAL | Status: DC | PRN

## 2014-07-11 MED ORDER — DOXYLAMINE SUCCINATE (SLEEP) 25 MG PO TABS
25.0000 mg | ORAL_TABLET | Freq: Every evening | ORAL | Status: DC | PRN
Start: 2014-07-11 — End: 2014-07-11
  Filled 2014-07-11: qty 1

## 2014-07-11 MED ORDER — BUPROPION HCL ER (XL) 300 MG PO TB24
300.0000 mg | ORAL_TABLET | Freq: Every day | ORAL | Status: DC
  Administered 2014-07-11: 300 mg via ORAL
  Filled 2014-07-11 (×3): qty 1

## 2014-07-11 MED ORDER — ZOLPIDEM TARTRATE 5 MG PO TABS: 5.0000 mg | ORAL_TABLET | Freq: Every evening | ORAL | Status: DC | PRN

## 2014-07-11 MED ORDER — PANTOPRAZOLE SODIUM 40 MG PO TBEC
40.0000 mg | DELAYED_RELEASE_TABLET | Freq: Every day | ORAL | Status: DC
  Administered 2014-07-11: 40 mg via ORAL
  Filled 2014-07-11: qty 1

## 2014-07-11 MED ORDER — OXYCODONE-ACETAMINOPHEN 5-325 MG PO TABS: 2.0000 | ORAL_TABLET | ORAL | Status: DC | PRN

## 2014-07-11 MED ORDER — LACTATED RINGERS IV SOLN: INTRAVENOUS | Status: DC

## 2014-07-11 MED ORDER — SIMETHICONE 80 MG PO CHEW: 80.0000 mg | CHEWABLE_TABLET | ORAL | Status: DC | PRN

## 2014-07-11 MED ORDER — IBUPROFEN 800 MG PO TABS: 800.0000 mg | ORAL_TABLET | Freq: Three times a day (TID) | ORAL | Status: DC | PRN

## 2014-07-11 MED ORDER — PRENATAL MULTIVITAMIN CH
1.0000 | ORAL_TABLET | Freq: Every day | ORAL | Status: DC
  Administered 2014-07-11: 1 via ORAL
  Filled 2014-07-11: qty 1

## 2014-07-11 MED ORDER — BENZOCAINE-MENTHOL 20-0.5 % EX AERO: 1.0000 "application " | INHALATION_SPRAY | CUTANEOUS | Status: DC | PRN

## 2014-07-11 NOTE — Anesthesia Postprocedure Evaluation (Signed)
Anesthesia Post Note  Patient: Anita Alexander  Procedure(s) Performed: Procedure(s) (LRB): Post Partum Tubal Ligation, Bilateral Salpingectomy (Bilateral)  Anesthesia type: Epidural  Patient location: Mother/Baby  Post pain: Pain level controlled  Post assessment: Post-op Vital signs reviewed  Last Vitals:  Filed Vitals:   07/11/14 0600  BP: 119/62  Pulse: 70  Temp: 37.1 C  Resp: 20    Post vital signs: Reviewed  Level of consciousness:alert  Complications: No apparent anesthesia complications

## 2014-07-11 NOTE — Progress Notes (Addendum)
Post Partum Day 1/POD #1 - PP BTL - B salpingectomy Subjective: no complaints, up ad lib, voiding, tolerating PO and nl lochia, pain controlled  Objective: Blood pressure 119/62, pulse 70, temperature 98.8 F (37.1 C), temperature source Oral, resp. rate 20, height 5\' 5"  (1.651 m), weight 87.091 kg (192 lb), last menstrual period 10/09/2013, SpO2 98.00%, unknown if currently breastfeeding.  Physical Exam:  General: alert and no distress Lochia: appropriate Uterine Fundus: firm Incision: healing well DVT Evaluation: No evidence of DVT seen on physical exam.   Recent Labs  07/10/14 0807 07/11/14 0608  HGB 10.0* 8.1*  HCT 30.6* 24.7*    Assessment/Plan: Plan for discharge tomorrow, Breastfeeding and Lactation consult.  Routine care.  Pt desires d/c to home.  Will d/c with Motrin, percocet, and PNV.  F/u 6 weeks.     LOS: 1 day   Bovard-Stuckert, Anita Alexander 07/11/2014, 7:44 AM

## 2014-07-11 NOTE — Addendum Note (Signed)
Addendum created 07/11/14 0931 by Flossie Dibble, CRNA   Modules edited: Notes Section   Notes Section:  File: [id number]

## 2014-07-11 NOTE — Progress Notes (Signed)
CSW acknowledges consult for MOB presenting with history of anxiety and depression.    Referral screened out by Clinical Social Worker because none of the following criteria appear to apply: ~ History of anxiety/depression during this pregnancy, or of post-partum depression. ~ Diagnosis of anxiety and/or depression within last 3 years ~ History of depression due to pregnancy loss/loss of child OR * Patient's symptoms currently being treated with medication and/or therapy. CSW completed chart review and noted that MOB is currently prescribed Wellbutrin.   Please contact the Clinical Social Worker if needs arise or if patient requests.

## 2014-07-11 NOTE — Anesthesia Postprocedure Evaluation (Signed)
Anesthesia Post Note  Patient: Anita Alexander  Procedure(s) Performed: * No procedures listed *  Anesthesia type: Epidural  Patient location: Mother/Baby  Post pain: Pain level controlled  Post assessment: Post-op Vital signs reviewed  Last Vitals:  Filed Vitals:   07/11/14 0600  BP: 119/62  Pulse: 70  Temp: 37.1 C  Resp: 20    Post vital signs: Reviewed  Level of consciousness:alert  Complications: No apparent anesthesia complications

## 2014-07-11 NOTE — Discharge Summary (Signed)
Obstetric Discharge Summary Reason for Admission: induction of labor Prenatal Procedures: none Intrapartum Procedures: spontaneous vaginal delivery Postpartum Procedures: none Complications-Operative and Postpartum: 1st  degree perineal laceration Hemoglobin  Date Value Ref Range Status  07/11/2014 8.1* 12.0 - 15.0 g/dL Final     DELTA CHECK NOTED     REPEATED TO VERIFY     HCT  Date Value Ref Range Status  07/11/2014 24.7* 36.0 - 46.0 % Final    Physical Exam:  General: alert and no distress Lochia: appropriate Uterine Fundus: firm Incision: healing well DVT Evaluation: No evidence of DVT seen on physical exam.  Discharge Diagnoses: Term Pregnancy-delivered  Discharge Information: Date: 07/11/2014 Activity: pelvic rest Diet: routine Medications: PNV, Ibuprofen and Percocet Condition: stable Instructions: refer to practice specific booklet Discharge to: home Follow-up Information   Follow up with Bovard-Stuckert, Ilithyia Titzer, MD. Schedule an appointment as soon as possible for a visit in 6 weeks. (call sooner if problems with incision, 6wk postpartum check)    Specialty:  Obstetrics and Gynecology   Contact information:   [address] [address] [phone]       Newborn Data: Live born female  Birth Weight: 9 lb 2.7 oz (4159 g) APGAR: 9, 9  Home with mother.  Bovard-Stuckert, Deaunna Olarte 07/11/2014, 8:03 AM

## 2014-07-11 NOTE — Lactation Note (Signed)
This note was copied from the chart of Anita Laiyla Zale. Lactation Consultation Note Mom prefers to pump and bottle feed breast milk. No plans on latching to breast. Mom did this with her 2 other children for 10 months each w/o difficulty. DEBP set up by RN, LC brought in cleaning supplies and asked mom did she remember how to clean it and stated yes. Mom has DEBP. Mom encouraged to feed baby 8-12 times/24 hours and with feeding cues.  Educated about newborn behavior. Mom encouraged to waken baby for feeds. [place] brochure given w/resources, support groups and [place] services. Encouraged to call for any assistance needed. Patient Name: Anita Alexander [id number] Date: 07/11/2014 Reason for consult: Initial assessment   Maternal Data Has patient been taught Hand Expression?: Yes Does the patient have breastfeeding experience prior to this delivery?: Yes  Feeding Feeding Type: Bottle Fed - Breast Milk Nipple Type: Slow - flow  LATCH Score/Interventions                      Lactation Tools Discussed/Used Tools: Pump Breast pump type: Double-Electric Breast Pump Pump Review: Setup, frequency, and cleaning;Milk Storage Initiated by:: RN Date initiated:: 07/10/14   Consult Status Consult Status: PRN Date: 07/11/14 Follow-up type: In-patient    [place] Pelto, Elta [place] 07/11/2014, 4:15 AM

## 2014-07-30 ENCOUNTER — Other Ambulatory Visit: Payer: Self-pay

## 2014-07-30 MED ORDER — PANTOPRAZOLE SODIUM 40 MG PO TBEC: 40.0000 mg | DELAYED_RELEASE_TABLET | Freq: Every day | ORAL | Status: DC

## 2014-07-30 NOTE — Telephone Encounter (Signed)
Refilled protonix after scheduling patient for follow up.

## 2014-08-06 ENCOUNTER — Encounter: Payer: Self-pay | Admitting: Physician Assistant

## 2014-08-06 ENCOUNTER — Ambulatory Visit (INDEPENDENT_AMBULATORY_CARE_PROVIDER_SITE_OTHER): Payer: 59 | Admitting: Physician Assistant

## 2014-08-06 VITALS — BP 134/85 | HR 70 | Temp 98.4°F | Ht 65.0 in | Wt 166.0 lb

## 2014-08-06 DIAGNOSIS — F411 Generalized anxiety disorder: Secondary | ICD-10-CM

## 2014-08-06 DIAGNOSIS — Z23 Encounter for immunization: Secondary | ICD-10-CM

## 2014-08-06 DIAGNOSIS — K219 Gastro-esophageal reflux disease without esophagitis: Secondary | ICD-10-CM

## 2014-08-06 DIAGNOSIS — F3289 Other specified depressive episodes: Secondary | ICD-10-CM

## 2014-08-06 DIAGNOSIS — F329 Major depressive disorder, single episode, unspecified: Secondary | ICD-10-CM

## 2014-08-06 MED ORDER — PANTOPRAZOLE SODIUM 40 MG PO TBEC: 40.0000 mg | DELAYED_RELEASE_TABLET | Freq: Every day | ORAL | Status: DC

## 2014-08-06 MED ORDER — BUPROPION HCL ER (XL) 300 MG PO TB24: 300.0000 mg | ORAL_TABLET | Freq: Every day | ORAL | Status: DC

## 2014-08-06 MED ORDER — BUSPIRONE HCL 15 MG PO TABS: 15.0000 mg | ORAL_TABLET | Freq: Two times a day (BID) | ORAL | Status: DC

## 2014-08-06 NOTE — Progress Notes (Signed)
   Subjective:    Patient ID: Anita Alexander, female    DOB: 1970-12-27, 43 y.o.   MRN: [id number]  HPI Pt is a 43 yo female who presents to the clinic to follow up on medications.   Depression/anxiety- doing well. Does have 58 week old at home that brings on some increased anxiety feelings. She is taking medications with no problems. She denies any suicidal or homicidal thoughts. Feels great.   GERD- doing well as long as she takes protonix.    Review of Systems  All other systems reviewed and are negative.      Objective:   Physical Exam  Constitutional: She is oriented to person, place, and time. She appears well-developed and well-nourished.  HENT:  Head: Normocephalic and atraumatic.  Cardiovascular: Normal rate, regular rhythm and normal heart sounds.   Pulmonary/Chest: Effort normal and breath sounds normal.  Neurological: She is alert and oriented to person, place, and time.  Skin: Skin is dry.  Psychiatric: She has a normal mood and affect. Her behavior is normal.          Assessment & Plan:  Depression/GAD- PHQ-9 was 1. GAD-7 was 7. Refilled wellbutrin and buspar for one year. Numbers stable.   GERD- refilled protonix for one year.

## 2014-09-08 ENCOUNTER — Encounter: Payer: Self-pay | Admitting: Physician Assistant

## 2014-09-19 ENCOUNTER — Ambulatory Visit: Payer: 59 | Admitting: Physician Assistant

## 2014-10-15 ENCOUNTER — Encounter: Payer: Self-pay | Admitting: Physician Assistant

## 2014-10-15 ENCOUNTER — Ambulatory Visit (INDEPENDENT_AMBULATORY_CARE_PROVIDER_SITE_OTHER): Payer: 59 | Admitting: Physician Assistant

## 2014-10-15 VITALS — BP 144/88 | HR 90 | Ht 65.0 in | Wt 157.0 lb

## 2014-10-15 DIAGNOSIS — IMO0001 Reserved for inherently not codable concepts without codable children: Secondary | ICD-10-CM

## 2014-10-15 DIAGNOSIS — R030 Elevated blood-pressure reading, without diagnosis of hypertension: Secondary | ICD-10-CM

## 2014-10-15 DIAGNOSIS — H8111 Benign paroxysmal vertigo, right ear: Secondary | ICD-10-CM

## 2014-10-15 DIAGNOSIS — H1132 Conjunctival hemorrhage, left eye: Secondary | ICD-10-CM

## 2014-10-15 MED ORDER — LISINOPRIL 20 MG PO TABS: 20.0000 mg | ORAL_TABLET | Freq: Every day | ORAL | Status: DC

## 2014-10-16 ENCOUNTER — Telehealth: Payer: Self-pay | Admitting: *Deleted

## 2014-10-16 LAB — CBC WITH DIFFERENTIAL/PLATELET
Basophils Absolute: 0 10*3/uL (ref 0.0–0.1)
Basophils Relative: 1 % (ref 0–1)
Eosinophils Absolute: 0 10*3/uL (ref 0.0–0.7)
Eosinophils Relative: 1 % (ref 0–5)
HEMATOCRIT: 37.4 % (ref 36.0–46.0)
HEMOGLOBIN: 12.4 g/dL (ref 12.0–15.0)
LYMPHS ABS: 1.8 10*3/uL (ref 0.7–4.0)
Lymphocytes Relative: 40 % (ref 12–46)
MCH: 26.3 pg (ref 26.0–34.0)
MCHC: 33.2 g/dL (ref 30.0–36.0)
MCV: 79.4 fL (ref 78.0–100.0)
MPV: 9.5 fL (ref 9.4–12.4)
Monocytes Absolute: 0.6 10*3/uL (ref 0.1–1.0)
Monocytes Relative: 12 % (ref 3–12)
NEUTROS ABS: 2.1 10*3/uL (ref 1.7–7.7)
NEUTROS PCT: 46 % (ref 43–77)
Platelets: 254 10*3/uL (ref 150–400)
RBC: 4.71 MIL/uL (ref 3.87–5.11)
RDW: 17.6 % — ABNORMAL HIGH (ref 11.5–15.5)
WBC: 4.6 10*3/uL (ref 4.0–10.5)

## 2014-10-16 LAB — VITAMIN D 25 HYDROXY (VIT D DEFICIENCY, FRACTURES): VIT D 25 HYDROXY: 20 ng/mL — AB (ref 30–100)

## 2014-10-16 LAB — COMPLETE METABOLIC PANEL WITH GFR
ALK PHOS: 66 U/L (ref 39–117)
ALT: 21 U/L (ref 0–35)
AST: 20 U/L (ref 0–37)
Albumin: 4.4 g/dL (ref 3.5–5.2)
BUN: 15 mg/dL (ref 6–23)
CO2: 28 mEq/L (ref 19–32)
Calcium: 9.2 mg/dL (ref 8.4–10.5)
Chloride: 106 mEq/L (ref 96–112)
Creat: 0.88 mg/dL (ref 0.50–1.10)
GFR, EST NON AFRICAN AMERICAN: 81 mL/min
GFR, Est African American: >89 mL/min
GLUCOSE: 97 mg/dL (ref 70–99)
Potassium: 4.1 mEq/L (ref 3.5–5.3)
SODIUM: 143 meq/L (ref 135–145)
TOTAL PROTEIN: 6.5 g/dL (ref 6.0–8.3)
Total Bilirubin: 0.2 mg/dL (ref 0.2–1.2)

## 2014-10-16 LAB — VITAMIN B12: VITAMIN B 12: 560 pg/mL (ref 211–911)

## 2014-10-16 LAB — TSH: TSH: 0.81 u[IU]/mL (ref 0.350–4.500)

## 2014-10-16 LAB — T4, FREE: Free T4: 1.04 ng/dL (ref 0.80–1.80)

## 2014-10-16 LAB — FERRITIN: FERRITIN: 11 ng/mL (ref 10–291)

## 2014-10-16 NOTE — Telephone Encounter (Signed)
Pt called today and was wondering if the lisinopril was safe for nursing. She forgot to mention that she is nursing when she was here yesterday.  I asked Dr. Darene Lamer because I know it's not safe during pregnancy and what he found said "unknown".  I advised pt not to take any more pills and that I would talk to you about her options.

## 2014-10-17 ENCOUNTER — Ambulatory Visit: Payer: 59 | Admitting: Physician Assistant

## 2014-10-17 ENCOUNTER — Encounter: Payer: Self-pay | Admitting: Physician Assistant

## 2014-10-17 DIAGNOSIS — H1130 Conjunctival hemorrhage, unspecified eye: Secondary | ICD-10-CM | POA: Insufficient documentation

## 2014-10-17 DIAGNOSIS — R790 Abnormal level of blood mineral: Secondary | ICD-10-CM | POA: Insufficient documentation

## 2014-10-17 NOTE — Telephone Encounter (Signed)
Pt.notified

## 2014-10-17 NOTE — Progress Notes (Signed)
   Subjective:    Patient ID: Anita Alexander, female    DOB: May 29, 1971, 43 y.o.   MRN: [id number]  HPI Patient presents to the clinic to discuss new onset of headaches and dizziness. Earlier this week she has a mother left sided subconjunctival hemorrhage. She went to the eye doctor and was worked up for any other pathologies. She minutes she's been having more headaches and feels very dizzy and lightheaded over the past 30 days. She just had a baby 3 months ago. She has not done anything to make better. She is not aware of anything that makes worse. She is taking wellbutrin/buspar and protoniix but no new medications. She denies any worsening anxiety. Patient denies any vision changes extremity weakness nausea or speech changes.   Review of Systems  All other systems reviewed and are negative.      Objective:   Physical Exam  Constitutional: She is oriented to person, place, and time. She appears well-developed and well-nourished.  HENT:  Head: Normocephalic and atraumatic.  Right Ear: External ear normal.  Left Ear: External ear normal.  Nose: Nose normal.  Mouth/Throat: Oropharynx is clear and moist.  Eyes:  Left medial subconjunctival hemorrhage  Neck: Normal range of motion. Neck supple.  Cardiovascular: Normal rate, regular rhythm and normal heart sounds.   Pulmonary/Chest: Effort normal and breath sounds normal.  Musculoskeletal:  Normal lower and upper extremity strength.  Lymphadenopathy:    She has no cervical adenopathy.  Neurological: She is alert and oriented to person, place, and time. She has normal reflexes. No cranial nerve deficit. Coordination normal.  Positive Dix Hallpike maneuver to the right.  Skin: Skin is dry.  Psychiatric: She has a normal mood and affect. Her behavior is normal.          Assessment & Plan:  Headaches/elevated blood pressure/dizziness/BPV- discuss with patient that Dix Hallpike maneuver to the right was positive. I gave patient a  Epley maneuvers to start 3 times a day. I instructed patient that these maneuvers can't take weeks to work. Will get some blood work to make sure there is no problems with her thyroid or vitamin deficiencies. She has just had a baby so there could be some anemia or deficiencies. Her blood pressure is certainly high today. We'll start a low dose lisinopril daily. Patient is very concerned and we discussed imaging. I would like to at least get blood work and try a week of therapy before getting an MRI of the brain. She does have a family history of brain tumor and that is on her mind. Discussed with her I did not see any red flags for brain tumor but we would certainly keep that on the differential.

## 2014-10-17 NOTE — Telephone Encounter (Signed)
Call pt: lisinopril has not been studied but other in the same class have and our found safe with breastfeeding. Safe to precede. If would like one of the ones studied to be called in we can but I most certainly think is safe.

## 2014-12-12 ENCOUNTER — Encounter: Payer: Self-pay | Admitting: Physician Assistant

## 2014-12-14 NOTE — Progress Notes (Signed)
This encounter was created in error - please disregard.

## 2014-12-16 LAB — CBC
HEMATOCRIT: 39 % (ref 36.0–46.0)
HEMOGLOBIN: 13.2 g/dL (ref 12.0–15.0)
MCH: 29.5 pg (ref 26.0–34.0)
MCHC: 33.8 g/dL (ref 30.0–36.0)
MCV: 87.1 fL (ref 78.0–100.0)
MPV: 9.2 fL (ref 8.6–12.4)
Platelets: 214 10*3/uL (ref 150–400)
RBC: 4.48 MIL/uL (ref 3.87–5.11)
RDW: 17.2 % — ABNORMAL HIGH (ref 11.5–15.5)
WBC: 5.7 10*3/uL (ref 4.0–10.5)

## 2014-12-17 LAB — FERRITIN: Ferritin: 29 ng/mL (ref 10–291)

## 2014-12-17 LAB — VITAMIN D 25 HYDROXY (VIT D DEFICIENCY, FRACTURES): Vit D, 25-Hydroxy: 30 ng/mL (ref 30–100)

## 2015-04-13 ENCOUNTER — Ambulatory Visit: Payer: Self-pay | Admitting: Family Medicine

## 2015-04-14 ENCOUNTER — Telehealth: Payer: Self-pay | Admitting: *Deleted

## 2015-04-14 NOTE — Telephone Encounter (Signed)
Pt called in this morning & left vm stating that for the last few days she's experienced some tightness in her chest and some sob.  She wanted to get in to be seen today.  I returned her phone call but had to leave a message.  I left a very detailed vm to go to the ER as all providers are full for today.  I advised her 3x times on the vm to go to the ER to have them run a full thing of tests.  She is Jade's pt but just wanted to give a heads up.

## 2015-04-14 NOTE — Telephone Encounter (Signed)
Agree with plan below.  Beatrice Lecher, MD

## 2015-05-20 ENCOUNTER — Encounter: Payer: Self-pay | Admitting: Family Medicine

## 2015-05-20 ENCOUNTER — Ambulatory Visit (INDEPENDENT_AMBULATORY_CARE_PROVIDER_SITE_OTHER): Payer: 59

## 2015-05-20 ENCOUNTER — Other Ambulatory Visit: Payer: Self-pay | Admitting: Physician Assistant

## 2015-05-20 ENCOUNTER — Ambulatory Visit (INDEPENDENT_AMBULATORY_CARE_PROVIDER_SITE_OTHER): Payer: 59 | Admitting: Family Medicine

## 2015-05-20 VITALS — BP 135/86 | HR 105 | Ht 65.0 in | Wt 154.0 lb

## 2015-05-20 DIAGNOSIS — R0602 Shortness of breath: Secondary | ICD-10-CM | POA: Diagnosis not present

## 2015-05-20 DIAGNOSIS — R0789 Other chest pain: Secondary | ICD-10-CM | POA: Diagnosis not present

## 2015-05-20 DIAGNOSIS — R791 Abnormal coagulation profile: Secondary | ICD-10-CM

## 2015-05-20 DIAGNOSIS — R799 Abnormal finding of blood chemistry, unspecified: Secondary | ICD-10-CM

## 2015-05-20 DIAGNOSIS — R030 Elevated blood-pressure reading, without diagnosis of hypertension: Secondary | ICD-10-CM | POA: Diagnosis not present

## 2015-05-20 DIAGNOSIS — L290 Pruritus ani: Secondary | ICD-10-CM

## 2015-05-20 DIAGNOSIS — K625 Hemorrhage of anus and rectum: Secondary | ICD-10-CM

## 2015-05-20 DIAGNOSIS — R790 Abnormal level of blood mineral: Secondary | ICD-10-CM

## 2015-05-20 DIAGNOSIS — R7989 Other specified abnormal findings of blood chemistry: Secondary | ICD-10-CM

## 2015-05-20 MED ORDER — LISINOPRIL 20 MG PO TABS: 20.0000 mg | ORAL_TABLET | Freq: Every day | ORAL | Status: DC

## 2015-05-20 MED ORDER — LIDOCAINE-HYDROCORTISONE ACE 2-2 % RE KIT: 1.0000 | PACK | Freq: Two times a day (BID) | RECTAL | Status: DC

## 2015-05-20 NOTE — Progress Notes (Signed)
   Subjective:    Patient ID: Anita Alexander, female    DOB: 07-20-1971, 44 y.o.   MRN: [id number]  HPI Hypertension- Pt denies chest pain, SOB, dizziness, or heart palpitations.  Taking meds as directed w/o problems.  Denies medication side effects. She's currently off the  lisinopril 20 mg daily. Her blood pressures have been much better at home. Most of them have been under 140. She her husband plan on getting pregnant again seen in the next couple of months.  CP that last for 10 min and happens mostly in the mornings and in the evening. Started in May.  Feels like a tightness in her back when it happens.  Occ feels SOB, almost panicky.  Radiates across her mid-chest.  Can happen at rest or acitvity.  Not triggered by acitivity or exercise. Happening almost daily.  No palpitations or skps.  She still has periods.  Hx of iron deficiency.  She is on a PPI.   Occ having blood in her stool. Last time was 3 days ago. Says has been on and off since had her baby 10 months ago. Blood is bright red when wipes. Has had some itching as well.  No real worsening or alleviating factors.  Review of Systems     Objective:   Physical Exam  Constitutional: She is oriented to person, place, and time. She appears well-developed and well-nourished.  HENT:  Head: Normocephalic and atraumatic.  Cardiovascular: Normal rate, regular rhythm and normal heart sounds.   Pulmonary/Chest: Effort normal and breath sounds normal.  Neurological: She is alert and oriented to person, place, and time.  Skin: Skin is warm and dry.  Psychiatric: She has a normal mood and affect. Her behavior is normal.          Assessment & Plan:  HTN - at goal today but borderline. Asked her to write down her BPs adn bring in log from home for [place] to review in about 8 weeks.  Due for CMP and lipids.    Atypical chest pain - EKG today shows rate of 85 bpm, normal sinus rhythm with normal axis. Unclear etiology at this point. Certainly  with her history of low iron we can recheck that. We'll also check her thyroid. We'll also do a d-dimer to rule out possible pulmonary embolism but overall I think she is low risk.  Rectal bleeding and itching - will rx rectal hydrocortisone/ lidocaine cream.  If symptoms not improving then please let me know.  History of low iron-she's not currently taking a supplement. We'll recheck her levels.

## 2015-05-21 LAB — COMPLETE METABOLIC PANEL WITH GFR
ALT: 16 U/L (ref 0–35)
AST: 14 U/L (ref 0–37)
Albumin: 3.9 g/dL (ref 3.5–5.2)
Alkaline Phosphatase: 38 U/L — ABNORMAL LOW (ref 39–117)
BILIRUBIN TOTAL: 0.5 mg/dL (ref 0.2–1.2)
BUN: 12 mg/dL (ref 6–23)
CALCIUM: 8.7 mg/dL (ref 8.4–10.5)
CO2: 27 mEq/L (ref 19–32)
Chloride: 105 mEq/L (ref 96–112)
Creat: 0.76 mg/dL (ref 0.50–1.10)
GFR, Est African American: >89 mL/min
Glucose, Bld: 95 mg/dL (ref 70–99)
POTASSIUM: 4.1 meq/L (ref 3.5–5.3)
SODIUM: 139 meq/L (ref 135–145)
TOTAL PROTEIN: 6.2 g/dL (ref 6.0–8.3)

## 2015-05-21 LAB — CBC WITH DIFFERENTIAL/PLATELET
BASOS ABS: 0 10*3/uL (ref 0.0–0.1)
Basophils Relative: 0 % (ref 0–1)
Eosinophils Absolute: 0.1 10*3/uL (ref 0.0–0.7)
Eosinophils Relative: 1 % (ref 0–5)
HEMATOCRIT: 38.7 % (ref 36.0–46.0)
Hemoglobin: 13.3 g/dL (ref 12.0–15.0)
LYMPHS ABS: 2 10*3/uL (ref 0.7–4.0)
Lymphocytes Relative: 36 % (ref 12–46)
MCH: 31.9 pg (ref 26.0–34.0)
MCHC: 34.4 g/dL (ref 30.0–36.0)
MCV: 92.8 fL (ref 78.0–100.0)
MPV: 9.4 fL (ref 8.6–12.4)
Monocytes Absolute: 0.2 10*3/uL (ref 0.1–1.0)
Monocytes Relative: 4 % (ref 3–12)
NEUTROS ABS: 3.3 10*3/uL (ref 1.7–7.7)
NEUTROS PCT: 59 % (ref 43–77)
PLATELETS: 216 10*3/uL (ref 150–400)
RBC: 4.17 MIL/uL (ref 3.87–5.11)
RDW: 13 % (ref 11.5–15.5)
WBC: 5.6 10*3/uL (ref 4.0–10.5)

## 2015-05-21 LAB — FERRITIN: FERRITIN: 45 ng/mL (ref 10–291)

## 2015-05-21 LAB — LIPID PANEL
CHOL/HDL RATIO: 2.5 ratio
Cholesterol: 152 mg/dL (ref 0–200)
HDL: 61 mg/dL (ref >=46)
LDL CALC: 70 mg/dL (ref 0–99)
TRIGLYCERIDES: 106 mg/dL (ref <150)
VLDL: 21 mg/dL (ref 0–40)

## 2015-05-21 LAB — TSH: TSH: 0.856 u[IU]/mL (ref 0.350–4.500)

## 2015-05-21 LAB — CK: CK TOTAL: 71 U/L (ref 7–177)

## 2015-05-22 ENCOUNTER — Other Ambulatory Visit: Payer: Self-pay | Admitting: Family Medicine

## 2015-05-22 ENCOUNTER — Telehealth: Payer: Self-pay | Admitting: Physician Assistant

## 2015-05-22 ENCOUNTER — Ambulatory Visit (INDEPENDENT_AMBULATORY_CARE_PROVIDER_SITE_OTHER): Payer: 59

## 2015-05-22 DIAGNOSIS — R7989 Other specified abnormal findings of blood chemistry: Secondary | ICD-10-CM

## 2015-05-22 DIAGNOSIS — R0789 Other chest pain: Secondary | ICD-10-CM | POA: Diagnosis not present

## 2015-05-22 DIAGNOSIS — R0602 Shortness of breath: Secondary | ICD-10-CM

## 2015-05-22 DIAGNOSIS — R791 Abnormal coagulation profile: Secondary | ICD-10-CM | POA: Diagnosis not present

## 2015-05-22 LAB — TROPONIN I: Troponin I: <0.01 ng/mL (ref <0.06)

## 2015-05-22 LAB — D-DIMER, QUANTITATIVE: D-Dimer, Quant: 0.66 ug/mL-FEU — ABNORMAL HIGH (ref 0.00–0.48)

## 2015-05-22 MED ORDER — IOHEXOL 350 MG/ML SOLN
120.0000 mL | Freq: Once | INTRAVENOUS | Status: AC | PRN
Start: 2015-05-22 — End: 2015-05-22
  Administered 2015-05-22: 140 mL via INTRAVENOUS

## 2015-05-22 NOTE — Addendum Note (Signed)
Addended by: Beatrice Lecher D on: 05/22/2015 08:42 AM   Modules accepted: Orders

## 2015-05-22 NOTE — Telephone Encounter (Signed)
Received fax from pharmacy for a drug change request on Lidocaine - Hydrocortisone Ace 2-2% I called the pharmacy to find out if they knew of a comparible drug that would be covered and they told me that this request was actually for a prior authorization. I sent through cover my meds now waiting on authorization. - CF

## 2015-06-01 NOTE — Telephone Encounter (Signed)
Received fax from [facility] and medication is a plan exclusion I called [facility] and spoke with Nesha and she stated that because the medication is a plan exclusion they do not offer alternative medications and we would just have to prescribe something and see if it is covered. - CF

## 2015-06-02 ENCOUNTER — Telehealth: Payer: Self-pay | Admitting: Family Medicine

## 2015-06-02 ENCOUNTER — Other Ambulatory Visit: Payer: Self-pay | Admitting: Family Medicine

## 2015-06-02 MED ORDER — LIDOCAINE-HYDROCORTISONE ACE 3-2.5 % RE KIT: 1.0000 "application " | PACK | Freq: Two times a day (BID) | RECTAL | Status: DC | PRN

## 2015-06-02 NOTE — Telephone Encounter (Signed)
Pt informed.Anita Alexander Lynetta  

## 2015-06-02 NOTE — Telephone Encounter (Signed)
Please call patient and let her know that the rectal cream that we had sent over was not covered by her insurance. I'm sending over a new prescription to see if this one will be covered.

## 2015-07-15 ENCOUNTER — Ambulatory Visit (INDEPENDENT_AMBULATORY_CARE_PROVIDER_SITE_OTHER): Payer: 59 | Admitting: Physician Assistant

## 2015-07-15 ENCOUNTER — Encounter: Payer: Self-pay | Admitting: Physician Assistant

## 2015-07-15 VITALS — BP 133/70 | HR 71 | Ht 65.0 in | Wt 161.0 lb

## 2015-07-15 DIAGNOSIS — F411 Generalized anxiety disorder: Secondary | ICD-10-CM | POA: Diagnosis not present

## 2015-07-15 DIAGNOSIS — F32A Depression, unspecified: Secondary | ICD-10-CM

## 2015-07-15 DIAGNOSIS — R030 Elevated blood-pressure reading, without diagnosis of hypertension: Secondary | ICD-10-CM | POA: Diagnosis not present

## 2015-07-15 DIAGNOSIS — F329 Major depressive disorder, single episode, unspecified: Secondary | ICD-10-CM | POA: Diagnosis not present

## 2015-07-15 DIAGNOSIS — IMO0001 Reserved for inherently not codable concepts without codable children: Secondary | ICD-10-CM | POA: Insufficient documentation

## 2015-07-15 MED ORDER — PANTOPRAZOLE SODIUM 40 MG PO TBEC: 40.0000 mg | DELAYED_RELEASE_TABLET | Freq: Every day | ORAL | Status: DC

## 2015-07-15 MED ORDER — BUPROPION HCL ER (XL) 300 MG PO TB24: 300.0000 mg | ORAL_TABLET | Freq: Every day | ORAL | Status: DC

## 2015-07-15 MED ORDER — BUSPIRONE HCL 15 MG PO TABS: 15.0000 mg | ORAL_TABLET | Freq: Two times a day (BID) | ORAL | Status: DC

## 2015-07-15 NOTE — Progress Notes (Signed)
   Subjective:    Patient ID: Kearney Hard, female    DOB: Aug 01, 1971, 44 y.o.   MRN: [id number]  HPI  Pt presents to the clinic for 8 week recheck on BP readings. She has stopped lisinopril due to wanting to try to get pregnant. BP's ranging 114-132/70-80. No CP, palpitations, headaches, dizziness.  Anxiety/depression- doing well. No problems or concerns. Very controlled.     Review of Systems  All other systems reviewed and are negative.      Objective:   Physical Exam  Constitutional: She is oriented to person, place, and time. She appears well-developed and well-nourished.  HENT:  Head: Normocephalic and atraumatic.  Cardiovascular: Normal rate, regular rhythm and normal heart sounds.   Pulmonary/Chest: Effort normal and breath sounds normal. She has no wheezes.  Neurological: She is alert and oriented to person, place, and time.  Skin: Skin is dry.  Psychiatric: She has a normal mood and affect. Her behavior is normal.          Assessment & Plan:  Elevated blood pressure- BP log looks great today. Off lisinopril. Stay off. Keep checking BP and monitoring symptoms. Ok to get pregnant. Follow up with any changes.   Anxiety/depression- refilled wellbutrin and buspar for 1 year. buspar should be ok during pregnancy discuss wellbutrin use with OB.

## 2015-08-05 ENCOUNTER — Other Ambulatory Visit: Payer: Self-pay

## 2015-08-05 DIAGNOSIS — Z1231 Encounter for screening mammogram for malignant neoplasm of breast: Secondary | ICD-10-CM

## 2015-08-11 ENCOUNTER — Other Ambulatory Visit: Payer: Self-pay | Admitting: Physician Assistant

## 2015-08-11 MED ORDER — PANTOPRAZOLE SODIUM 40 MG PO TBEC: 40.0000 mg | DELAYED_RELEASE_TABLET | Freq: Every day | ORAL | Status: DC

## 2015-08-11 MED ORDER — BUSPIRONE HCL 15 MG PO TABS: 15.0000 mg | ORAL_TABLET | Freq: Two times a day (BID) | ORAL | Status: DC

## 2015-08-11 MED ORDER — BUPROPION HCL ER (XL) 300 MG PO TB24: 300.0000 mg | ORAL_TABLET | Freq: Every day | ORAL | Status: DC

## 2015-08-28 LAB — HM MAMMOGRAPHY

## 2015-09-09 ENCOUNTER — Ambulatory Visit: Payer: 59

## 2015-10-22 ENCOUNTER — Encounter: Payer: Self-pay | Admitting: Family Medicine

## 2015-10-22 ENCOUNTER — Ambulatory Visit (INDEPENDENT_AMBULATORY_CARE_PROVIDER_SITE_OTHER): Payer: 59 | Admitting: Family Medicine

## 2015-10-22 ENCOUNTER — Other Ambulatory Visit: Payer: Self-pay | Admitting: Family Medicine

## 2015-10-22 VITALS — BP 151/84 | HR 79 | Wt 162.0 lb

## 2015-10-22 DIAGNOSIS — I10 Essential (primary) hypertension: Secondary | ICD-10-CM | POA: Diagnosis not present

## 2015-10-22 MED ORDER — HYDROCHLOROTHIAZIDE 25 MG PO TABS: 25.0000 mg | ORAL_TABLET | Freq: Every day | ORAL | Status: DC

## 2015-10-22 NOTE — Assessment & Plan Note (Signed)
Now poorly controlled, will start HCTZ given impending IVF procedure. Follow up in 1 month for electrolytes and kidney function.

## 2015-10-22 NOTE — Patient Instructions (Signed)
Thank you for coming in today. You were seen today for elevated blood pressures.  We will start HCTZ.  Return to care in 1 month for blood work to check the medications efficacy and effect on kidney.

## 2015-10-22 NOTE — Progress Notes (Signed)
Anita Alexander is a 44 y.o. female who presents to [facility] today for elevated blood pressure.  Patient has noted elevated blood pressure on home cuff, SBP high 140s to low 150s. Patient was on lisinopril in September but came off it due to adequate BP control and plan to get pregnant.   Patient also has a Right thumb laceration sustained the night before last while cutting some vegetables at home. Since then, she has not noted erythema, cyanosis, purulence. She used glue to close it but would like to have it checked out.    Past Medical History  Diagnosis Date  . SVD (spontaneous vaginal delivery)     x 2  . MVP (mitral valve prolapse)     no problems, no prior treatment for dental/surgical per pt  . Anxiety     no meds currently  . Depression     no meds currently  . GERD (gastroesophageal reflux disease)   . Headache(784.0)     migraine  . MVP (mitral valve prolapse)   . Advanced maternal age (AMA), 40 years or greater 07/09/2014  . SVD (spontaneous vaginal delivery) 07/10/2014  . S/P tubal ligation 07/10/2014   Past Surgical History  Procedure Laterality Date  . Dilation and curettage of uterus      mab x 1  . Colonoscopy    . Finger surgery      cyst removed  . Wisdom tooth extraction    . Tubal ligation Bilateral 07/10/2014    Procedure: Post Partum Tubal Ligation, Bilateral Salpingectomy;  Surgeon: Janyth Contes, MD;  Location: [facility];  Service: Gynecology;  Laterality: Bilateral;   Social History  Substance Use Topics  . Smoking status: Former Smoker -- 1.00 packs/day for 7 years    Types: Cigarettes    Quit date: 11/07/1998  . Smokeless tobacco: Not on file  . Alcohol Use: No     Comment: daily   family history includes Hypertension in her father.  ROS as above Medications: Current Outpatient Prescriptions  Medication Sig Dispense Refill  .  acetaminophen (TYLENOL) 325 MG tablet Take 325-650 mg by mouth every 6 (six) hours as needed for headache. Depends on pain if takes 1 or 2 tablets    . buPROPion (WELLBUTRIN XL) 300 MG 24 hr tablet Take 1 tablet (300 mg total) by mouth daily. 90 tablet 3  . busPIRone (BUSPAR) 15 MG tablet Take 1 tablet (15 mg total) by mouth 2 (two) times daily. 180 tablet 3  . cholecalciferol (VITAMIN D) 1000 UNITS tablet Take 1,000 Units by mouth daily.    Marland Kitchen ibuprofen (ADVIL,MOTRIN) 800 MG tablet Take 1 tablet (800 mg total) by mouth every 8 (eight) hours as needed. 45 tablet 1  . pantoprazole (PROTONIX) 40 MG tablet Take 1 tablet (40 mg total) by mouth daily. 90 tablet 3  . prenatal vitamin w/FE, FA (PRENATAL 1 + 1) 27-1 MG TABS tablet TK 1 T PO D AT 12  NOON UTD  9   No current facility-administered medications for this visit.   No Known Allergies   Exam:  BP 151/84 mmHg  Pulse 79  Wt 162 lb (73.483 kg) Gen: Well appearing woman in NAD HEENT: EOMI,  MMM Lungs: Normal work of breathing. CTABL Heart: RRR no MRG Exts: Brisk capillary refill, warm and well perfused.  R thumb laceration: 1cm semicircular distal tip, no erythema surrounding, purulence noted   Labs reviewed July 2016.  No results found for this or any previous visit (from the past 24 hour(s)). No results found.   Please see individual assessment and plan sections.

## 2015-11-18 ENCOUNTER — Ambulatory Visit (INDEPENDENT_AMBULATORY_CARE_PROVIDER_SITE_OTHER): Payer: 59 | Admitting: Physician Assistant

## 2015-11-18 ENCOUNTER — Encounter: Payer: Self-pay | Admitting: Physician Assistant

## 2015-11-18 VITALS — BP 138/88 | HR 82 | Ht 65.0 in | Wt 168.0 lb

## 2015-11-18 DIAGNOSIS — I10 Essential (primary) hypertension: Secondary | ICD-10-CM | POA: Diagnosis not present

## 2015-11-18 MED ORDER — HYDROCHLOROTHIAZIDE 25 MG PO TABS: 25.0000 mg | ORAL_TABLET | Freq: Every day | ORAL | Status: DC

## 2015-11-18 NOTE — Progress Notes (Signed)
   Subjective:    Patient ID: Anita Alexander, female    DOB: Feb 17, 1971, 45 y.o.   MRN: [id number]  HPI Patient is a 45 year old female who presents to the clinic to follow-up on hypertension. In September 2016 had taken her off her blood pressure medications due to a long period of time with good blood pressure control. She came back in November and her blood pressure was elevated again. She was restarted on hydrochlorothiazide. She continues to check her blood pressures and they're ranging in the 125/135 on top and 80-90 on bottom. She denies any chest pains, palpitations, vision changes, headaches or dizziness. She is considering IVF. She has not made her final decision at this time.   Review of Systems  All other systems reviewed and are negative.      Objective:   Physical Exam  Constitutional: She is oriented to person, place, and time. She appears well-developed and well-nourished.  HENT:  Head: Normocephalic and atraumatic.  Cardiovascular: Normal rate, regular rhythm and normal heart sounds.   Pulmonary/Chest: Effort normal and breath sounds normal. She has no wheezes.  Neurological: She is alert and oriented to person, place, and time.  Psychiatric: She has a normal mood and affect. Her behavior is normal.          Assessment & Plan:  Hypertension- rechecked and below 140/90. Pt reports checking and usually around 128-135/80-90. Continue HCTZ. Follow up in 6 months. Continue to monitor at home. Pt is still deciding about IVF. May consider lisinopril/HCTZ combination if pt decides not to proceed. Will wait to consider in 6 months.

## 2015-11-19 ENCOUNTER — Ambulatory Visit: Payer: 59 | Admitting: Family Medicine

## 2015-12-16 ENCOUNTER — Ambulatory Visit (INDEPENDENT_AMBULATORY_CARE_PROVIDER_SITE_OTHER): Payer: 59 | Admitting: Physician Assistant

## 2015-12-16 ENCOUNTER — Encounter: Payer: Self-pay | Admitting: Physician Assistant

## 2015-12-16 VITALS — BP 134/64 | HR 81 | Ht 65.0 in | Wt 162.0 lb

## 2015-12-16 DIAGNOSIS — K2100 Gastro-esophageal reflux disease with esophagitis, without bleeding: Secondary | ICD-10-CM

## 2015-12-16 DIAGNOSIS — R635 Abnormal weight gain: Secondary | ICD-10-CM

## 2015-12-16 DIAGNOSIS — K589 Irritable bowel syndrome without diarrhea: Secondary | ICD-10-CM

## 2015-12-16 DIAGNOSIS — R21 Rash and other nonspecific skin eruption: Secondary | ICD-10-CM | POA: Diagnosis not present

## 2015-12-16 MED ORDER — PHENTERMINE HCL 37.5 MG PO TABS: 37.5000 mg | ORAL_TABLET | Freq: Every day | ORAL | Status: DC

## 2015-12-16 MED ORDER — TRIAMCINOLONE ACETONIDE 0.1 % EX CREA: 1.0000 "application " | TOPICAL_CREAM | Freq: Two times a day (BID) | CUTANEOUS | Status: DC

## 2015-12-16 MED ORDER — METHYLPREDNISOLONE SODIUM SUCC 125 MG IJ SOLR
125.0000 mg | Freq: Once | INTRAMUSCULAR | Status: AC
  Administered 2015-12-16: 125 mg via INTRAMUSCULAR

## 2015-12-16 NOTE — Addendum Note (Signed)
Addended by: Donella Stade on: 12/16/2015 02:46 PM   Modules accepted: Orders

## 2015-12-16 NOTE — Progress Notes (Signed)
   Subjective:    Patient ID: Anita Alexander, female    DOB: November 29, 1970, 45 y.o.   MRN: [id number]  HPI  Pt is a 45 yo female who presents to the clinic with 1 week of itching all over body and rash on bilateral hands and neck. She has tried benadryl for itching and helped some as well as hydrocortisone on hands with no relief. No new lotions, creams, medications or foods. She always washes her hands a lot. She has been cooking a lot of bread. She is very concerned about a gluten sensitivity or celiac disease. She did some research and has GERD, Depression, GI issues, and now rash.   She is also wanting something to help with weight gain. Gained weight since birth of last child. Having trouble getting weight off.      Review of Systems  All other systems reviewed and are negative.      Objective:   Physical Exam  Constitutional: She is oriented to person, place, and time. She appears well-developed and well-nourished.  HENT:  Head: Normocephalic and atraumatic.  Cardiovascular: Normal rate, regular rhythm and normal heart sounds.   Pulmonary/Chest: Effort normal and breath sounds normal.  Neurological: She is alert and oriented to person, place, and time.  Skin:  Slightly raised macular rash with tiny white bumps on top in clusters covering bilateral hands mostly on dorsal surface.   Tiny white papules on a erythematous base bilateral neck.   Psychiatric: She has a normal mood and affect. Her behavior is normal.          Assessment & Plan:  RASH, GERD, IBS- discussed rash certainly looks allergic. 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 Unclear if any relationship to celiac disease but will check. Will look for any IgE or IgG allergies. CBC ordered to look at differential. Solumedrol 125mg  IM given in office today. Triamcinolone given for rash. Follow up as needed.   Abnormal weight gain- phentermine started. Discussed SE's. Follow up in 1 month. Discussed exercise and diet  control.

## 2015-12-18 LAB — GLIA (IGA/G) + TTG IGA
GLIADIN IGA: 3 U (ref <20)
GLIADIN IGG: 2 U (ref <20)
TISSUE TRANSGLUTAMINASE AB, IGA: 1 U/mL (ref <4)

## 2015-12-18 LAB — CBC WITH DIFFERENTIAL/PLATELET
BASOS ABS: 0 10*3/uL (ref 0.0–0.1)
Basophils Relative: 0 % (ref 0–1)
Eosinophils Absolute: 0 10*3/uL (ref 0.0–0.7)
Eosinophils Relative: 0 % (ref 0–5)
HEMATOCRIT: 38.9 % (ref 36.0–46.0)
HEMOGLOBIN: 13.2 g/dL (ref 12.0–15.0)
LYMPHS ABS: 2.2 10*3/uL (ref 0.7–4.0)
Lymphocytes Relative: 26 % (ref 12–46)
MCH: 31.1 pg (ref 26.0–34.0)
MCHC: 33.9 g/dL (ref 30.0–36.0)
MCV: 91.5 fL (ref 78.0–100.0)
MONOS PCT: 7 % (ref 3–12)
MPV: 9.6 fL (ref 8.6–12.4)
Monocytes Absolute: 0.6 10*3/uL (ref 0.1–1.0)
NEUTROS PCT: 67 % (ref 43–77)
Neutro Abs: 5.7 10*3/uL (ref 1.7–7.7)
Platelets: 252 10*3/uL (ref 150–400)
RBC: 4.25 MIL/uL (ref 3.87–5.11)
RDW: 13 % (ref 11.5–15.5)
WBC: 8.5 10*3/uL (ref 4.0–10.5)

## 2015-12-18 LAB — ALLERGEN MILK: Milk IgE: <0.1 kU/L

## 2015-12-18 LAB — ALLERGEN, WHEAT, F4

## 2015-12-21 ENCOUNTER — Encounter: Payer: Self-pay | Admitting: Physician Assistant

## 2015-12-21 DIAGNOSIS — Z91011 Allergy to milk products: Secondary | ICD-10-CM | POA: Insufficient documentation

## 2015-12-21 LAB — IGG FOOD PANEL
ALLERGEN, MILK, IGG: 8.47 ug/mL — AB (ref <0.15)
Beef, IgG: 8.2 ug/mL — ABNORMAL HIGH (ref <2.0)
Egg yolk, IgG: <2 ug/mL (ref <2.0)

## 2015-12-22 ENCOUNTER — Other Ambulatory Visit: Payer: Self-pay | Admitting: *Deleted

## 2015-12-22 ENCOUNTER — Encounter: Payer: Self-pay | Admitting: Physician Assistant

## 2015-12-22 DIAGNOSIS — Z91018 Allergy to other foods: Secondary | ICD-10-CM | POA: Insufficient documentation

## 2015-12-22 DIAGNOSIS — Z91014 Allergy to mammalian meats: Secondary | ICD-10-CM | POA: Insufficient documentation

## 2015-12-22 MED ORDER — PREDNISONE 50 MG PO TABS: ORAL_TABLET | ORAL | Status: DC

## 2016-01-12 ENCOUNTER — Ambulatory Visit: Payer: 59

## 2016-03-21 ENCOUNTER — Other Ambulatory Visit: Payer: Self-pay | Admitting: *Deleted

## 2016-03-21 MED ORDER — HYDROCHLOROTHIAZIDE 25 MG PO TABS: 25.0000 mg | ORAL_TABLET | Freq: Every day | ORAL | Status: DC

## 2016-05-17 ENCOUNTER — Ambulatory Visit: Payer: 59 | Admitting: Physician Assistant

## 2016-07-14 ENCOUNTER — Ambulatory Visit (INDEPENDENT_AMBULATORY_CARE_PROVIDER_SITE_OTHER): Payer: 59 | Admitting: Family Medicine

## 2016-07-14 VITALS — BP 136/92 | HR 81 | Wt 161.0 lb

## 2016-07-14 DIAGNOSIS — F411 Generalized anxiety disorder: Secondary | ICD-10-CM

## 2016-07-14 DIAGNOSIS — R5382 Chronic fatigue, unspecified: Secondary | ICD-10-CM | POA: Diagnosis not present

## 2016-07-14 DIAGNOSIS — I10 Essential (primary) hypertension: Secondary | ICD-10-CM

## 2016-07-14 DIAGNOSIS — Z23 Encounter for immunization: Secondary | ICD-10-CM

## 2016-07-14 DIAGNOSIS — F329 Major depressive disorder, single episode, unspecified: Secondary | ICD-10-CM | POA: Diagnosis not present

## 2016-07-14 DIAGNOSIS — R079 Chest pain, unspecified: Secondary | ICD-10-CM

## 2016-07-14 DIAGNOSIS — F32A Depression, unspecified: Secondary | ICD-10-CM

## 2016-07-14 LAB — CBC
HEMATOCRIT: 38.3 % (ref 35.0–45.0)
HEMOGLOBIN: 13.3 g/dL (ref 11.7–15.5)
MCH: 31.7 pg (ref 27.0–33.0)
MCHC: 34.7 g/dL (ref 32.0–36.0)
MCV: 91.2 fL (ref 80.0–100.0)
MPV: 9.7 fL (ref 7.5–12.5)
Platelets: 283 10*3/uL (ref 140–400)
RBC: 4.2 MIL/uL (ref 3.80–5.10)
RDW: 12.9 % (ref 11.0–15.0)
WBC: 5.5 10*3/uL (ref 3.8–10.8)

## 2016-07-14 LAB — IRON AND TIBC
%SAT: 31 % (ref 11–50)
IRON: 104 ug/dL (ref 40–190)
TIBC: 333 ug/dL (ref 250–450)
UIBC: 229 ug/dL (ref 125–400)

## 2016-07-14 LAB — T3, FREE: T3, Free: 3.3 pg/mL (ref 2.3–4.2)

## 2016-07-14 LAB — COMPREHENSIVE METABOLIC PANEL
ALBUMIN: 4.3 g/dL (ref 3.6–5.1)
ALT: 19 U/L (ref 6–29)
AST: 14 U/L (ref 10–30)
Alkaline Phosphatase: 38 U/L (ref 33–115)
BUN: 11 mg/dL (ref 7–25)
CALCIUM: 9.2 mg/dL (ref 8.6–10.2)
CHLORIDE: 102 mmol/L (ref 98–110)
CO2: 26 mmol/L (ref 20–31)
Creat: 0.76 mg/dL (ref 0.50–1.10)
Glucose, Bld: 102 mg/dL — ABNORMAL HIGH (ref 65–99)
Potassium: 3.5 mmol/L (ref 3.5–5.3)
SODIUM: 140 mmol/L (ref 135–146)
Total Bilirubin: 0.4 mg/dL (ref 0.2–1.2)
Total Protein: 6.5 g/dL (ref 6.1–8.1)

## 2016-07-14 LAB — TSH: TSH: 0.78 mIU/L

## 2016-07-14 LAB — T4, FREE: FREE T4: 1.2 ng/dL (ref 0.8–1.8)

## 2016-07-14 MED ORDER — SERTRALINE HCL 25 MG PO TABS: 25.0000 mg | ORAL_TABLET | Freq: Every day | ORAL | 0 refills | Status: DC

## 2016-07-14 NOTE — Patient Instructions (Signed)
Thank you for coming in today. Get labs today.  Start zoloft daily.  Increase to 2 pills daily (50mg ) in 1 week.  Return in 2 weeks.  Call or go to the emergency room if you get worse, have trouble breathing, have chest pains, or palpitations.   You should hear from both the heart ultrasound people as well as the counseling people soon.

## 2016-07-14 NOTE — Addendum Note (Signed)
Addended by: Delrae Alfred on: 07/14/2016 12:19 PM   Modules accepted: Orders

## 2016-07-14 NOTE — Progress Notes (Signed)
Anita Alexander is a 45 y.o. female who presents to [facility] today for anxiety and chest pain.  Anxiety: Patient is worsening anxiety symptoms over the last several months. Previously she was attempting to conceive and was on medicines that were safe for pregnancy. She is no longer attempting to conceive and is interested in more effective medications.  She currently takes Wellbutrin and BuSpar which she does not find to be very effective. She has bothersome anxiety symptoms including irritability worrying and difficulty sleeping. These are very severe interfere with her life.  Additionally she notes chest pain. She has been having intermittent central nonradiating nonexertional chest pain for the last year. She had a limited workup about a year ago with a normal EKG and CT angiogram. She notes symptoms persist. No significant fevers or chills nausea vomiting or diarrhea.   Hypertension.  Patient is doing well with hydrochlorothiazide. She notes chest pains as above but otherwise feels well with no palpitations shortness of breath fevers chills nausea vomiting or diarrhea.    Past Medical History:  Diagnosis Date  . Advanced maternal age (AMA), 40 years or greater 07/09/2014  . Anxiety    no meds currently  . Depression    no meds currently  . GERD (gastroesophageal reflux disease)   . Headache(784.0)    migraine  . MVP (mitral valve prolapse)    no problems, no prior treatment for dental/surgical per pt  . MVP (mitral valve prolapse)   . S/P tubal ligation 07/10/2014  . SVD (spontaneous vaginal delivery)    x 2  . SVD (spontaneous vaginal delivery) 07/10/2014   Past Surgical History:  Procedure Laterality Date  . COLONOSCOPY    . DILATION AND CURETTAGE OF UTERUS     mab x 1  . FINGER SURGERY     cyst removed  . TUBAL LIGATION Bilateral 07/10/2014   Procedure:  Post Partum Tubal Ligation, Bilateral Salpingectomy;  Surgeon: Janyth Contes, MD;  Location: [facility];  Service: Gynecology;  Laterality: Bilateral;  . WISDOM TOOTH EXTRACTION     Social History  Substance Use Topics  . Smoking status: Former Smoker    Packs/day: 1.00    Years: 7.00    Types: Cigarettes    Quit date: 11/07/1998  . Smokeless tobacco: Not on file  . Alcohol use No     Comment: daily   family history includes Hypertension in her father.  ROS as above:  Medications: Current Outpatient Prescriptions  Medication Sig Dispense Refill  . buPROPion (WELLBUTRIN XL) 300 MG 24 hr tablet Take 1 tablet (300 mg total) by mouth daily. 90 tablet 3  . busPIRone (BUSPAR) 15 MG tablet Take 1 tablet (15 mg total) by mouth 2 (two) times daily. 180 tablet 3  . doxylamine, Sleep, (UNISOM) 25 MG tablet Take 25 mg by mouth at bedtime as needed.    . hydrochlorothiazide (HYDRODIURIL) 25 MG tablet Take 1 tablet (25 mg total) by mouth daily. 90 tablet 4  . pantoprazole (PROTONIX) 40 MG tablet Take 1 tablet (40 mg total) by mouth daily. 90 tablet 3  . phentermine (ADIPEX-P) 37.5 MG tablet Take 1 tablet (37.5 mg total) by mouth daily before breakfast. 30 tablet 0  . predniSONE (DELTASONE) 50 MG tablet Take one tab daily for 5 days. 5 tablet 0  . sertraline (ZOLOFT) 25 MG tablet Take 1 tablet (25 mg total) by mouth daily. 30 tablet 0  .  triamcinolone cream (KENALOG) 0.1 % Apply 1 application topically 2 (two) times daily. 80 g 0   No current facility-administered medications for this visit.    No Known Allergies   Exam:  BP (!) 136/92   Pulse 81   Wt 161 lb (73 kg)   BMI 26.79 kg/m  Gen: Well NAD Nontoxic appearing HEENT: EOMI,  MMM no goiter Lungs: Normal work of breathing. CTABL Heart: RRR no MRG Abd: NABS, Soft. Nondistended, Nontender Exts: Brisk capillary refill, warm and well perfused.  Psych: Alert and oriented normal speech thought process and affect. No SI or HI  expressed.  GAD 7 : Generalized Anxiety Score 07/14/2016  Nervous, Anxious, on Edge 3  Control/stop worrying 3  Worry too much - different things 3  Trouble relaxing 3  Restless 2  Easily annoyed or irritable 3  Afraid - awful might happen 3  Total GAD 7 Score 20  Anxiety Difficulty Very difficult    Depression screen PHQ 2/9 07/14/2016  Decreased Interest 0  Down, Depressed, Hopeless 1  PHQ - 2 Score 1  Altered sleeping 3  Tired, decreased energy 3  Change in appetite 1  Feeling bad or failure about yourself  1  Trouble concentrating 0  Moving slowly or fidgety/restless 0  Suicidal thoughts 0  PHQ-9 Score 9     No results found for this or any previous visit (from the past 24 hour(s)). No results found.  Twelve-lead EKG shows normal sinus rhythm at 72 bpm. No ST segment elevation or depression. QTC 438. Poor R wave progression.  Assessment and Plan: 45 y.o. female with  1) anxiety: Not well-controlled. Continue Wellbutrin and BuSpar. Add Zoloft. Start at 25 mg and increase to 50 mg daily after 1 week. Recheck in 2 weeks. Obtained Limited laboratory workup to evaluate for thyroid or sex hormone dysfunction.  2) chest pain: Unclear etiology. Unlikely cardiac however she does have a slightly abnormal EKG. We'll obtain echocardiogram as well as fasting labs  3) hypertension: Doing quite well with hydrochlorothiazide. Continue current regimen. Fasting lab workup.   Orders Placed This Encounter  Procedures  . Flu Vaccine QUAD 36+ mos IM  . CBC  . Comprehensive metabolic panel    Order Specific Question:   Has the patient fasted?    Answer:   No  . Follicle stimulating hormone  . LH  . Estrogens, total  . Progesterone  . T3, free  . T4, free  . TSH  . Iron and TIBC  . Ambulatory referral to Psychology    Referral Priority:   Routine    Referral Type:   Psychiatric    Referral Reason:   Specialty Services Required    Requested Specialty:   Psychology    Number of  Visits Requested:   1  . ECHOCARDIOGRAM COMPLETE    Standing Status:   Future    Standing Expiration Date:   10/13/2017    Order Specific Question:   Where should this test be performed    Answer:   [facility]    Order Specific Question:   Complete or Limited study?    Answer:   Complete    Order Specific Question:   Does the patient have a known history of hypersensitivity to Perflutren (aka Scientist, research (medical) for echocardiograms - CHECK ALLERGIES)    Answer:   No    Order Specific Question:   ADMINISTER PERFLUTERN    Answer:   ADMINISTER PERFLUTREN  Order Specific Question:   Expected Date:    Answer:   1 month    Order Specific Question:   Reason for exam-Echo    Answer:   Chest Pain  786.50 / R07.9    Discussed warning signs or symptoms. Please see discharge instructions. Patient expresses understanding.

## 2016-07-15 LAB — LUTEINIZING HORMONE: LH: 5.6 m[IU]/mL

## 2016-07-15 LAB — PROGESTERONE: Progesterone: 0.5 ng/mL

## 2016-07-15 LAB — FOLLICLE STIMULATING HORMONE: FSH: 6 m[IU]/mL

## 2016-07-16 ENCOUNTER — Other Ambulatory Visit: Payer: Self-pay | Admitting: Physician Assistant

## 2016-07-17 LAB — ESTROGENS, TOTAL: ESTROGEN: 205.6 pg/mL

## 2016-07-18 ENCOUNTER — Telehealth: Payer: Self-pay | Admitting: Physician Assistant

## 2016-07-18 NOTE — Telephone Encounter (Signed)
I called patient and gave her husband Echo appt info as she instructed me to do// Scheduled for Wed Sep 20th check in at 10:15 for a 10:30 appt. At [facility]

## 2016-07-22 ENCOUNTER — Emergency Department
Admission: EM | Admit: 2016-07-22 | Discharge: 2016-07-22 | Disposition: A | Discharge status: Home / Self Care | Payer: 59 | Source: Home / Self Care | Attending: Family Medicine | Admitting: Family Medicine

## 2016-07-22 ENCOUNTER — Emergency Department (INDEPENDENT_AMBULATORY_CARE_PROVIDER_SITE_OTHER): Payer: 59

## 2016-07-22 ENCOUNTER — Encounter: Payer: Self-pay | Admitting: *Deleted

## 2016-07-22 DIAGNOSIS — M479 Spondylosis, unspecified: Secondary | ICD-10-CM

## 2016-07-22 DIAGNOSIS — M7712 Lateral epicondylitis, left elbow: Secondary | ICD-10-CM

## 2016-07-22 DIAGNOSIS — M5412 Radiculopathy, cervical region: Secondary | ICD-10-CM

## 2016-07-22 HISTORY — DX: Essential (primary) hypertension: I10

## 2016-07-22 MED ORDER — PREDNISONE 20 MG PO TABS: ORAL_TABLET | ORAL | 0 refills | Status: DC

## 2016-07-22 NOTE — ED Provider Notes (Signed)
[facility]    CSN: [id number] Arrival date & time: 07/22/16  [id number]  First Provider Contact:  None       History   Chief Complaint Chief Complaint  Patient presents with  . Shoulder Pain  . Numbness    left forearm/hand    HPI Anita Alexander is a 45 y.o. female.   Patient complains of 3 to 4 day history of vague aching burning pain in her left neck and scapula radiating to her left upper anterior chest.  She notices milder discomfort in her right scapular area.  Today she experienced tingling in her left lateral elbow radiating to her left 5th finger.  She recalls no recent injury or changes in physical activities.  She notes that she carries her 57 pound 5 year-old child on her left arm. She has a history of vague intermittent chest pain with history of normal EKG and CT angiogram in the past.  Her PCP has her scheduled for echocardiogram   The history is provided by the patient.  Shoulder Pain  Upper extremity pain location: left scapula. Injury: no   Pain details:    Quality:  Aching   Radiates to: left neck and left anteror chest.   Severity:  Mild   Onset quality:  Gradual   Duration:  4 days   Timing:  Constant   Progression:  Unchanged Handedness:  Right-handed Prior injury to area:  No Relieved by:  Nothing Worsened by:  Movement Ineffective treatments:  NSAIDs and heat Associated symptoms: neck pain and tingling   Associated symptoms: no fatigue, no fever, no muscle weakness, no numbness, no stiffness and no swelling     Past Medical History:  Diagnosis Date  . Advanced maternal age (AMA), 40 years or greater 07/09/2014  . Anxiety    no meds currently  . Depression    no meds currently  . GERD (gastroesophageal reflux disease)   . Headache(784.0)    migraine  . HTN (hypertension)   . MVP (mitral valve prolapse)    no problems, no prior treatment for dental/surgical per pt  . MVP (mitral valve prolapse)   . S/P tubal ligation 07/10/2014  .  SVD (spontaneous vaginal delivery)    x 2  . SVD (spontaneous vaginal delivery) 07/10/2014    Patient Active Problem List   Diagnosis Date Noted  . 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 IBS (irritable bowel syndrome) 12/16/2015  . Abnormal weight gain 12/16/2015  . Low iron stores 10/17/2014  . Normal pregnancy, repeat 07/10/2014  . S/P tubal ligation 07/10/2014  . Advanced maternal age (AMA), 40 years or greater 07/09/2014  . GAD (generalized anxiety disorder) 02/08/2013  . IRRITABILITY 10/26/2010  . IRON DEFICIENCY 06/19/2010  . UNSPECIFIED VITAMIN D DEFICIENCY 06/16/2010  . Depression 06/16/2010  . ESOPHAGEAL REFLUX 06/16/2010  . Fatigue 06/16/2010  . HTN (hypertension) 06/16/2010    Past Surgical History:  Procedure Laterality Date  . COLONOSCOPY    . DILATION AND CURETTAGE OF UTERUS     mab x 1  . FINGER SURGERY     cyst removed  . TUBAL LIGATION Bilateral 07/10/2014   Procedure: Post Partum Tubal Ligation, Bilateral Salpingectomy;  Surgeon: Janyth Contes, MD;  Location: [facility];  Service: Gynecology;  Laterality: Bilateral;  . WISDOM TOOTH EXTRACTION      OB History    Gravida Para Term Preterm AB Living   7 3 3   4  3  SAB TAB Ectopic Multiple Live Births   4       3       Home Medications    Prior to Admission medications   Medication Sig Start Date End Date Taking? Authorizing Provider  buPROPion (WELLBUTRIN XL) 300 MG 24 hr tablet Take 1 tablet by mouth  daily 07/18/16   Jade L Breeback, PA-C  busPIRone (BUSPAR) 15 MG tablet Take 1 tablet (15 mg total) by mouth 2 (two) times daily. 08/11/15   Jade L Breeback, PA-C  doxylamine, Sleep, (UNISOM) 25 MG tablet Take 25 mg by mouth at bedtime as needed.    Historical Provider, MD  hydrochlorothiazide (HYDRODIURIL) 25 MG tablet Take 1 tablet (25 mg total) by mouth daily. 03/21/16   Jade L Breeback, PA-C  pantoprazole (PROTONIX) 40 MG tablet Take 1 tablet (40 mg total) by mouth daily. 08/11/15  08/10/16  Donella Stade, PA-C  phentermine (ADIPEX-P) 37.5 MG tablet Take 1 tablet (37.5 mg total) by mouth daily before breakfast. 12/16/15   Jade L Breeback, PA-C  predniSONE (DELTASONE) 20 MG tablet Take one tab by mouth twice daily for 5 days, then one daily for 3 days. Take with food. 07/22/16   Kandra Nicolas, MD  sertraline (ZOLOFT) 25 MG tablet Take 1 tablet (25 mg total) by mouth daily. 07/14/16   Gregor Hams, MD    Family History Family History  Problem Relation Age of Onset  . Hypertension Father   . Hyperlipidemia      Social History Social History  Substance Use Topics  . Smoking status: Former Smoker    Packs/day: 1.00    Years: 7.00    Types: Cigarettes    Quit date: 11/07/1998  . Smokeless tobacco: Never Used  . Alcohol use No     Comment: daily     Allergies   Review of patient's allergies indicates no known allergies.   Review of Systems Review of Systems  Constitutional: Negative for fatigue and fever.  Eyes: Negative.   Respiratory: Negative.   Cardiovascular: Negative.   Gastrointestinal: Negative.   Genitourinary: Negative.   Musculoskeletal: Positive for neck pain. Negative for stiffness.       Upper back pain, neck soreness, and left upper chest soreness.  Also left elbow pain  Skin: Negative.   Neurological: Positive for headaches.     Physical Exam Triage Vital Signs ED Triage Vitals  Enc Vitals Group     BP 07/22/16 0835 131/87     Pulse Rate 07/22/16 0835 78     Resp --      Temp --      Temp src --      SpO2 07/22/16 0835 100 %     Weight 07/22/16 0836 162 lb (73.5 kg)     Height --      Head Circumference --      Peak Flow --      Pain Score --      Pain Loc --      Pain Edu? --      Excl. in [place]? --    No data found.   Updated Vital Signs BP 131/87 (BP Location: Left Arm)   Pulse 78   Wt 162 lb (73.5 kg)   LMP 07/16/2016   SpO2 100%   BMI 26.96 kg/m   Visual Acuity Right Eye Distance:   Left Eye Distance:     Bilateral Distance:    Right Eye Near:   Left  Eye Near:    Bilateral Near:     Physical Exam  Constitutional: She is oriented to person, place, and time. She appears well-developed and well-nourished. No distress.  HENT:  Head: Normocephalic.  Nose: Nose normal.  Mouth/Throat: Oropharynx is clear and moist.  Eyes: EOM are normal. Pupils are equal, round, and reactive to light.  Neck: Normal range of motion.    There is tenderness to palpation over the sternocleidomastoid muscles bilaterally and trapezius muscles bilaterally, more prominent on the right.  Cardiovascular: Normal heart sounds.   Pulmonary/Chest: Breath sounds normal.        There is distinct tenderness over medial and inferior edges of left scapula.  Pain elicited by resisted abduction of left shoulder while palpating left rhomboid muscles.  There is mild tenderness of the right rhomboid muscles as well.  Patient describes burning discomfort in her left upper chest but there is no tenderness to palpation there.  Musculoskeletal:       Left forearm: She exhibits tenderness. She exhibits no bony tenderness and no swelling.       Arms: There is distinct tenderness over the left lateral epicondyle.  Palpation there during resisted dorsiflexion and supination of the wrist recreates her pain.  No tenderness to palpation over the left upper arm.  Left shoulder has full range of motion.  Lymphadenopathy:    She has no cervical adenopathy.  Neurological: She is alert and oriented to person, place, and time.  Skin: Skin is warm and dry. No rash noted.  Nursing note and vitals reviewed.    UC Treatments / Results  Labs (all labs ordered are listed, but only abnormal results are displayed) Labs Reviewed - No data to display  EKG  EKG Interpretation None       Radiology Dg Cervical Spine Complete  Result Date: 07/22/2016 CLINICAL DATA:  Left side neck and shoulder pain for 3-4 days. No known injury. EXAM:  CERVICAL SPINE - COMPLETE 4+ VIEW COMPARISON:  None. FINDINGS: Mild disc space narrowing from C3-4 through C5-6. Mild spurring. Normal alignment. No fracture. Mild right neural foraminal narrowing at C4-5 due to uncovertebral spurring. Prevertebral soft tissues are normal. IMPRESSION: Mild spondylosis.  No acute bony abnormality. Electronically Signed   By: Rolm Baptise M.D.   On: 07/22/2016 09:48    Procedures Procedures (including critical care time)  Medications Ordered in UC Medications - No data to display   Initial Impression / Assessment and Plan / UC Course  I have reviewed the triage vital signs and the nursing notes.  Pertinent labs & imaging results that were available during my care of the patient were reviewed by me and considered in my medical decision making (see chart for details).  Clinical Course  Begin prednisone burst/taper. Apply ice pack for 20 to 30 minutes, 3 to 4 times daily  Continue until pain decreases. Begin neck exercises as tolerated.  Try carrying your child on right arm. Wear left elbow brace.  Begin elbow exercises. Followup with Dr. Aundria Mems or Dr. Lynne Leader ([facility]) if not improving about two weeks.     Final Clinical Impressions(s) / UC Diagnoses   Final diagnoses:  Left lateral epicondylitis  Cervical radiculopathy    New Prescriptions New Prescriptions   PREDNISONE (DELTASONE) 20 MG TABLET    Take one tab by mouth twice daily for 5 days, then one daily for 3 days. Take with food.     Kandra Nicolas, MD 07/22/16 1040

## 2016-07-22 NOTE — ED Triage Notes (Signed)
Pt saw Dr. Georgina Snell last week for ongoing CP, scheduled for cardiac [place]. Today she c/o left shoulder pain/burning that has been intermittent but worse over past couple days. Pain does into her neck. Last night developed numbness in her left lower FA and hand. Used IBF and heating pad.

## 2016-07-22 NOTE — Discharge Instructions (Signed)
Apply ice pack for 20 to 30 minutes, 3 to 4 times daily  Continue until pain decreases. Begin neck exercises as tolerated.  Try carrying your child on right arm. Wear left elbow brace.  Begin elbow exercises.

## 2016-07-26 ENCOUNTER — Telehealth: Payer: Self-pay | Admitting: *Deleted

## 2016-07-26 NOTE — Telephone Encounter (Signed)
LM to check pt's status and ask her to call back if she has any questions or concerns. Charna Archer, LPN

## 2016-07-27 ENCOUNTER — Ambulatory Visit (HOSPITAL_BASED_OUTPATIENT_CLINIC_OR_DEPARTMENT_OTHER)
Admission: RE | Admit: 2016-07-27 | Discharge: 2016-07-27 | Disposition: A | Discharge status: Home / Self Care | Payer: 59 | Source: Ambulatory Visit | Attending: Family Medicine | Admitting: Family Medicine

## 2016-07-27 DIAGNOSIS — F329 Major depressive disorder, single episode, unspecified: Secondary | ICD-10-CM | POA: Diagnosis not present

## 2016-07-27 DIAGNOSIS — Z87891 Personal history of nicotine dependence: Secondary | ICD-10-CM | POA: Diagnosis not present

## 2016-07-27 DIAGNOSIS — R079 Chest pain, unspecified: Secondary | ICD-10-CM | POA: Diagnosis present

## 2016-07-27 DIAGNOSIS — F411 Generalized anxiety disorder: Secondary | ICD-10-CM | POA: Diagnosis not present

## 2016-07-27 DIAGNOSIS — R5382 Chronic fatigue, unspecified: Secondary | ICD-10-CM | POA: Diagnosis not present

## 2016-07-27 DIAGNOSIS — I340 Nonrheumatic mitral (valve) insufficiency: Secondary | ICD-10-CM | POA: Diagnosis not present

## 2016-07-27 DIAGNOSIS — I10 Essential (primary) hypertension: Secondary | ICD-10-CM | POA: Insufficient documentation

## 2016-07-27 DIAGNOSIS — F32A Depression, unspecified: Secondary | ICD-10-CM

## 2016-07-27 HISTORY — PX: TRANSTHORACIC ECHOCARDIOGRAM: SHX275

## 2016-07-27 NOTE — Progress Notes (Signed)
  Echocardiogram 2D Echocardiogram has been performed.  Tresa Res 07/27/2016, 10:44 AM

## 2016-07-28 ENCOUNTER — Ambulatory Visit (INDEPENDENT_AMBULATORY_CARE_PROVIDER_SITE_OTHER): Payer: 59 | Admitting: Family Medicine

## 2016-07-28 ENCOUNTER — Encounter: Payer: Self-pay | Admitting: Family Medicine

## 2016-07-28 VITALS — BP 119/80 | HR 85 | Wt 163.0 lb

## 2016-07-28 DIAGNOSIS — R079 Chest pain, unspecified: Secondary | ICD-10-CM | POA: Diagnosis not present

## 2016-07-28 DIAGNOSIS — I10 Essential (primary) hypertension: Secondary | ICD-10-CM | POA: Diagnosis not present

## 2016-07-28 DIAGNOSIS — F329 Major depressive disorder, single episode, unspecified: Secondary | ICD-10-CM

## 2016-07-28 DIAGNOSIS — F32A Depression, unspecified: Secondary | ICD-10-CM

## 2016-07-28 MED ORDER — SERTRALINE HCL 100 MG PO TABS: 100.0000 mg | ORAL_TABLET | Freq: Every day | ORAL | 1 refills | Status: DC

## 2016-07-28 NOTE — Progress Notes (Signed)
Anita Alexander is a 45 y.o. female who presents to [facility] today for follow-up of depression chest pain and hypertension.  Depression/anxiety: Patient was seen about 2 weeks ago and started on Zoloft. She is titrated to 50 mg and is feeling better. She notes less symptoms. Additionally she notes her chest pain has resolved.  Additionally she was seen for chest pain. EKG at that time was unremarkable and she subsequently had an echocardiogram that was also normal.  As noted above the chest pain has resolved with antidepression medication.  Hypertension: Doing well with the below medication. No subsequent chest pain fevers chills.   Past Medical History:  Diagnosis Date  . Advanced maternal age (AMA), 40 years or greater 07/09/2014  . Anxiety    no meds currently  . Depression    no meds currently  . GERD (gastroesophageal reflux disease)   . Headache(784.0)    migraine  . HTN (hypertension)   . MVP (mitral valve prolapse)    no problems, no prior treatment for dental/surgical per pt  . MVP (mitral valve prolapse)   . S/P tubal ligation 07/10/2014  . SVD (spontaneous vaginal delivery)    x 2  . SVD (spontaneous vaginal delivery) 07/10/2014   Past Surgical History:  Procedure Laterality Date  . COLONOSCOPY    . DILATION AND CURETTAGE OF UTERUS     mab x 1  . FINGER SURGERY     cyst removed  . TUBAL LIGATION Bilateral 07/10/2014   Procedure: Post Partum Tubal Ligation, Bilateral Salpingectomy;  Surgeon: Janyth Contes, MD;  Location: [facility];  Service: Gynecology;  Laterality: Bilateral;  . WISDOM TOOTH EXTRACTION     Social History  Substance Use Topics  . Smoking status: Former Smoker    Packs/day: 1.00    Years: 7.00    Types: Cigarettes    Quit date: 11/07/1998  . Smokeless tobacco: Never Used  . Alcohol use No     Comment: daily   family  history includes Hypertension in her father.  ROS as above:  Medications: Current Outpatient Prescriptions  Medication Sig Dispense Refill  . buPROPion (WELLBUTRIN XL) 300 MG 24 hr tablet Take 1 tablet by mouth  daily 90 tablet 1  . doxylamine, Sleep, (UNISOM) 25 MG tablet Take 25 mg by mouth at bedtime as needed.    . hydrochlorothiazide (HYDRODIURIL) 25 MG tablet Take 1 tablet (25 mg total) by mouth daily. 90 tablet 4  . pantoprazole (PROTONIX) 40 MG tablet Take 1 tablet (40 mg total) by mouth daily. 90 tablet 3  . sertraline (ZOLOFT) 100 MG tablet Take 1 tablet (100 mg total) by mouth daily. 30 tablet 1   No current facility-administered medications for this visit.    No Known Allergies   Exam:  BP 119/80   Pulse 85   Wt 163 lb (73.9 kg)   LMP 07/16/2016   BMI 27.12 kg/m  Gen: Well NAD  Psych: Alert and oriented normal speech thought process and affect.  Depression screen [facility] 2/9 07/28/2016 07/14/2016  Decreased Interest 0 0  Down, Depressed, Hopeless 0 1  PHQ - 2 Score 0 1  Altered sleeping 2 3  Tired, decreased energy 3 3  Change in appetite 2 1  Feeling bad or failure about yourself  2 1  Trouble concentrating 0 0  Moving slowly or fidgety/restless 0 0  Suicidal thoughts 0 0  PHQ-9 Score 9 9  GAD 7 : Generalized Anxiety Score 07/28/2016 07/14/2016  Nervous, Anxious, on Edge 2 3  Control/stop worrying 3 3  Worry too much - different things 3 3  Trouble relaxing 1 3  Restless 0 2  Easily annoyed or irritable 3 3  Afraid - awful might happen 3 3  Total GAD 7 Score 15 20  Anxiety Difficulty Somewhat difficult Very difficult      No results found for this or any previous visit (from the past 24 hour(s)). No results found.    Assessment and Plan: 45 y.o. female with  Depression and anxiety: Subjectively improved and improved on GAD 7 as well. Increase Zoloft to 100 mg and recheck in 2-4 weeks.  Chest pain: Resolved. Echocardiogram normal. Watchful  waiting.  Hypertension: At goal. Continue current regimen.   No orders of the defined types were placed in this encounter.   Discussed warning signs or symptoms. Please see discharge instructions. Patient expresses understanding.

## 2016-07-28 NOTE — Patient Instructions (Signed)
Thank you for coming in today. Increase Zoloft to 100mg  daily in 1 week.  Return in 2-4 weeks for recheck with myself or Jade.   Major Depressive Disorder Major depressive disorder is a mental illness. It also may be called clinical depression or unipolar depression. Major depressive disorder usually causes feelings of sadness, hopelessness, or helplessness. Some people with this disorder do not feel particularly sad but lose interest in doing things they used to enjoy (anhedonia). Major depressive disorder also can cause physical symptoms. It can interfere with work, school, relationships, and other normal everyday activities. The disorder varies in severity but is longer lasting and more serious than the sadness we all feel from time to time in our lives. Major depressive disorder often is triggered by stressful life events or major life changes. Examples of these triggers include divorce, loss of your job or home, a move, and the death of a family member or close friend. Sometimes this disorder occurs for no obvious reason at all. People who have family members with major depressive disorder or bipolar disorder are at higher risk for developing this disorder, with or without life stressors. Major depressive disorder can occur at any age. It may occur just once in your life (single episode major depressive disorder). It may occur multiple times (recurrent major depressive disorder). SYMPTOMS People with major depressive disorder have either anhedonia or depressed mood on nearly a daily basis for at least 2 weeks or longer. Symptoms of depressed mood include:  Feelings of sadness (blue or down in the dumps) or emptiness.  Feelings of hopelessness or helplessness.  Tearfulness or episodes of crying (may be observed by others).  Irritability (children and adolescents). In addition to depressed mood or anhedonia or both, people with this disorder have at least four of the following  symptoms:  Difficulty sleeping or sleeping too much.   Significant change (increase or decrease) in appetite or weight.   Lack of energy or motivation.  Feelings of guilt and worthlessness.   Difficulty concentrating, remembering, or making decisions.  Unusually slow movement (psychomotor retardation) or restlessness (as observed by others).   Recurrent wishes for death, recurrent thoughts of self-harm (suicide), or a suicide attempt. People with major depressive disorder commonly have persistent negative thoughts about themselves, other people, and the world. People with severe major depressive disorder may experiencedistorted beliefs or perceptions about the world (psychotic delusions). They also may see or hear things that are not real (psychotic hallucinations). DIAGNOSIS Major depressive disorder is diagnosed through an assessment by your health care provider. Your health care provider will ask aboutaspects of your daily life, such as mood,sleep, and appetite, to see if you have the diagnostic symptoms of major depressive disorder. Your health care provider may ask about your medical history and use of alcohol or drugs, including prescription medicines. Your health care provider also may do a physical exam and blood work. This is because certain medical conditions and the use of certain substances can cause major depressive disorder-like symptoms (secondary depression). Your health care provider also may refer you to a mental health specialist for further evaluation and treatment. TREATMENT It is important to recognize the symptoms of major depressive disorder and seek treatment. The following treatments can be prescribed for this disorder:   Medicine. Antidepressant medicines usually are prescribed. Antidepressant medicines are thought to correct chemical imbalances in the brain that are commonly associated with major depressive disorder. Other types of medicine may be added if the  symptoms do not  respond to antidepressant medicines alone or if psychotic delusions or hallucinations occur.  Talk therapy. Talk therapy can be helpful in treating major depressive disorder by providing support, education, and guidance. Certain types of talk therapy also can help with negative thinking (cognitive behavioral therapy) and with relationship issues that trigger this disorder (interpersonal therapy). A mental health specialist can help determine which treatment is best for you. Most people with major depressive disorder do well with a combination of medicine and talk therapy. Treatments involving electrical stimulation of the brain can be used in situations with extremely severe symptoms or when medicine and talk therapy do not work over time. These treatments include electroconvulsive therapy, transcranial magnetic stimulation, and vagal nerve stimulation.   This information is not intended to replace advice given to you by your health care provider. Make sure you discuss any questions you have with your health care provider.   Document Released: 02/18/2013 Document Revised: 11/14/2014 Document Reviewed: 02/18/2013 Elsevier Interactive Patient Education [employer].

## 2016-08-23 ENCOUNTER — Encounter (HOSPITAL_COMMUNITY): Payer: Self-pay | Admitting: Licensed Clinical Social Worker

## 2016-08-23 ENCOUNTER — Ambulatory Visit (INDEPENDENT_AMBULATORY_CARE_PROVIDER_SITE_OTHER): Payer: 59 | Admitting: Licensed Clinical Social Worker

## 2016-08-23 DIAGNOSIS — F411 Generalized anxiety disorder: Secondary | ICD-10-CM | POA: Diagnosis not present

## 2016-08-23 NOTE — Progress Notes (Signed)
Comprehensive Clinical Assessment (CCA) Note  08/23/2016 Anita Alexander [id number]  Visit Diagnosis:      ICD-9-CM ICD-10-CM   1. Generalized anxiety disorder 300.02 F41.1       CCA Part One  Part One has been completed on paper by the patient.  (See scanned document in Chart Review)  CCA Part Two A  Intake/Chief Complaint:  CCA Intake With Chief Complaint CCA Part Two Date: 08/23/16 CCA Part Two Time: 1103 Chief Complaint/Presenting Problem: Anxiety Patients Currently Reported Symptoms/Problems: Symptoms have improved since she got on Zoloft approximately 3 weeks ago.  Excessive worry about something bad happening.  Sometimes wakes up in the middle of the night with panic symptoms.  Loses track of things around the house.  Distracted easily.  Feels overwhelmed thinking about everything that needs to be done.     Individual's Strengths: "I think I am a good mom."   Individual's Preferences: Wants to stop feeling like she isn't getting anywhere, stop worrying about things she has no control over   Type of Services Patient Feels Are Needed: Therapy Initial Clinical Notes/Concerns: Has seen therapists off and on for years.  Last time was 8 years ago.  Used to have panic attacks.  Hasn't had one in about 10 years.    Mental Health Symptoms Depression:  Depression: Increase/decrease in appetite, Sleep (too much or little) (Since starting Zoloft appetite has increased and sleeping more)  Mania:  Mania: N/A  Anxiety:   Anxiety: Difficulty concentrating, Fatigue, Irritability, Sleep, Worrying  Psychosis:  Psychosis: N/A  Trauma:  Trauma: N/A  Obsessions:  Obsessions: N/A  Compulsions:  Compulsions: N/A  Inattention:  Inattention: Disorganized, Loses things  Hyperactivity/Impulsivity:  Hyperactivity/Impulsivity: N/A  Oppositional/Defiant Behaviors:  Oppositional/Defiant Behaviors: N/A  Borderline Personality:  Emotional Irregularity: N/A  Other Mood/Personality Symptoms:      Mental  Status Exam Appearance and self-care  Stature:  Stature: Average  Weight:  Weight: Average weight  Clothing:  Clothing: Casual  Grooming:  Grooming: Normal  Cosmetic use:  Cosmetic Use: None  Posture/gait:  Posture/Gait: Normal  Motor activity:  Motor Activity: Not Remarkable  Sensorium  Attention:  Attention: Normal  Concentration:  Concentration: Normal  Orientation:  Orientation: X5  Recall/memory:  Recall/Memory:  (Reports she is often forgetful)  Affect and Mood  Affect:  Affect: Appropriate  Mood:  Mood: Anxious  Relating  Eye contact:  Eye Contact: Normal  Facial expression:  Facial Expression: Responsive  Attitude toward examiner:  Attitude Toward Examiner: Cooperative  Thought and Language  Speech flow: Speech Flow: Pressured  Thought content:     Preoccupation:  Preoccupations: Ruminations  Hallucinations:     Organization:     Transport planner of Knowledge:  Fund of Knowledge: Average  Intelligence:  Intelligence: Average  Abstraction:  Abstraction: Normal  Judgement:  Judgement: Normal  Reality Testing:  Reality Testing: Adequate  Insight:  Insight: Fair  Decision Making:  Decision Making: Normal  Social Functioning  Social Maturity:  Social Maturity:  (Has a few close friends)  Social Judgement:  Social Judgement: Normal  Stress  Stressors:  Stressors:  (Very stressful caring for her 44 year old)  Coping Ability:  Coping Ability: Overwhelmed, Research officer, political party Deficits:     Supports:      Family and Psychosocial History: Family history Marital status: Married Number of Years Married: 82 What types of issues is patient dealing with in the relationship?: She has not been as interested in being intimate. Additional  relationship information: "Our relationship is good."   Does patient have children?: Yes How many children?: 3 How is patient's relationship with their children?: Sophie (8)  Charlie (6)  Natalie (2)  Recognizes that she has a tendency  to be overprotective.  Feels as though she doesn't get enough one on one time with each of her kids enjoying spending time with them.   Childhood History:  Childhood History By whom was/is the patient raised?: Both parents Description of patient's relationship with caregiver when they were a child: "I was never really close to my dad."  Doesn't remember mom taking time to spend time with her or her siblings Patient's description of current relationship with people who raised him/her: Close with mom now  Still not close with dad Does patient have siblings?: Yes Number of Siblings: 3 Description of patient's current relationship with siblings: Brother, Josh (40) Brother, Ysidro Evert (43) and sister Ria Comment (41)   Good relationship with her sister.  They talk most days.  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 Has patient ever been in the [employer]?: No Are There Guns or Other Weapons in [place]?: Yes Types of Guns/Weapons: Guns Are These Psychologist, educational?: Yes (Locked in a safe)  Education: Education Did Teacher, adult education From [employer]?: Yes Did Physicist, medical?: Yes Did [place]?: Yes 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 Did You Have Any Difficulty At School?: No  Religion: Religion/Spirituality Are You A Religious Person?: No  Leisure/Recreation: Leisure / Recreation Leisure and Hobbies: Cleaning the house, running errands, caring for her kids  Likes to read   Exercise/Diet: Exercise/Diet Do You Exercise?: Yes What Type of Exercise Do You Do?:  Run/Walk How Many Times a Week Do You Exercise?: 1-3 times a week Have You Gained or Lost A Significant Amount of Weight in the Past Six Months?: No Do You Follow a Special Diet?: No Do You Have Any Trouble Sleeping?: Yes Explanation of Sleeping Difficulties: Trouble staying asleep because her mind is so full of worries  CCA Part Two C  Alcohol/Drug Use: Alcohol / Drug Use Prescriptions: Takes as prescribed History of alcohol / drug use?:  (Drinks alcohol on a daily basis-2-4 beers  Denies her drinking causing any problems.  Admits to a history of heavy drinking in college which did cause problems.)                      CCA Part Three  ASAM's:  Six Dimensions of Multidimensional Assessment  Dimension 1:  Acute Intoxication and/or Withdrawal Potential:     Dimension 2:  Biomedical Conditions and Complications:     Dimension 3:  Emotional, Behavioral, or Cognitive Conditions and Complications:     Dimension 4:  Readiness to Change:     Dimension 5:  Relapse, Continued use, or Continued Problem Potential:     Dimension 6:  Recovery/Living Environment:      Substance use Disorder (SUD)  NA  Social Function:  Social Functioning Social Maturity:  (Has a few close friends) Social Judgement: Normal  Stress:  Stress Stressors:  (  Very stressful caring for her 44 year old) Coping Ability: Overwhelmed, Exhausted Patient Takes Medications The Way The Doctor Instructed?: Yes  Risk Assessment- Self-Harm Potential: Risk Assessment For Self-Harm Potential Thoughts of Self-Harm: No current thoughts Additional Comments for Self-Harm Potential: Denies any recent SI  Risk Assessment -Dangerous to Others Potential: Risk Assessment For Dangerous to Others Potential Method: No Plan Additional Comments for Danger to Others Potential: Denies history of harm to others  DSM5 Diagnoses: Patient Active Problem List   Diagnosis Date Noted  . 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 IBS (irritable bowel syndrome) 12/16/2015  . Abnormal weight gain 12/16/2015  . Low iron stores 10/17/2014  . Normal pregnancy, repeat 07/10/2014  . S/P tubal ligation 07/10/2014  . Advanced maternal age (AMA), 40 years or greater 07/09/2014  . GAD (generalized anxiety disorder) 02/08/2013  . IRRITABILITY 10/26/2010  . IRON DEFICIENCY 06/19/2010  . UNSPECIFIED VITAMIN D DEFICIENCY 06/16/2010  . Depression 06/16/2010  . ESOPHAGEAL REFLUX 06/16/2010  . Fatigue 06/16/2010  . HTN (hypertension) 06/16/2010      Recommendations for Services/Supports/Treatments: Recommendations for Services/Supports/Treatments Recommendations For Services/Supports/Treatments: Individual Therapy  Garnette Scheuermann

## 2016-08-30 LAB — HM MAMMOGRAPHY

## 2016-08-30 LAB — HM PAP SMEAR: HM Pap smear: NEGATIVE

## 2016-09-01 ENCOUNTER — Ambulatory Visit (INDEPENDENT_AMBULATORY_CARE_PROVIDER_SITE_OTHER): Payer: 59 | Admitting: Family Medicine

## 2016-09-01 VITALS — BP 129/66 | HR 73 | Wt 165.0 lb

## 2016-09-01 DIAGNOSIS — I10 Essential (primary) hypertension: Secondary | ICD-10-CM | POA: Diagnosis not present

## 2016-09-01 DIAGNOSIS — F3342 Major depressive disorder, recurrent, in full remission: Secondary | ICD-10-CM

## 2016-09-01 DIAGNOSIS — F411 Generalized anxiety disorder: Secondary | ICD-10-CM

## 2016-09-01 MED ORDER — SERTRALINE HCL 100 MG PO TABS: 100.0000 mg | ORAL_TABLET | Freq: Every day | ORAL | 1 refills | Status: DC

## 2016-09-01 NOTE — Progress Notes (Signed)
Anita Alexander is a 45 y.o. female who presents to [facility] [place] today for follow up of depression.  She endorses an improved mood since increasing Zoloft from 50 mg to 100 mg last month. PHQ 9 score is 4 today, down from 9 last month. She feels that her appetite has increased since starting Zoloft and reports gaining 4-5 lbs since last month, although this weight fluctuation is not abnormal for her.   She reports home BPs in the normal range.    She saw her OB/GYN this week and got a pap smear and mammogram.    Past Medical History:  Diagnosis Date  . Advanced maternal age (AMA), 40 years or greater 07/09/2014  . Anxiety    no meds currently  . Depression    no meds currently  . GERD (gastroesophageal reflux disease)   . Headache(784.0)    migraine  . HTN (hypertension)   . MVP (mitral valve prolapse)    no problems, no prior treatment for dental/surgical per pt  . MVP (mitral valve prolapse)   . S/P tubal ligation 07/10/2014  . SVD (spontaneous vaginal delivery)    x 2  . SVD (spontaneous vaginal delivery) 07/10/2014   Past Surgical History:  Procedure Laterality Date  . COLONOSCOPY    . DILATION AND CURETTAGE OF UTERUS     mab x 1  . FINGER SURGERY     cyst removed  . TUBAL LIGATION Bilateral 07/10/2014   Procedure: Post Partum Tubal Ligation, Bilateral Salpingectomy;  Surgeon: Janyth Contes, MD;  Location: [facility];  Service: Gynecology;  Laterality: Bilateral;  . WISDOM TOOTH EXTRACTION     Social History  Substance Use Topics  . Smoking status: Former Smoker    Packs/day: 1.00    Years: 7.00    Types: Cigarettes    Quit date: 11/07/1998  . Smokeless tobacco: Never Used  . Alcohol use No     Comment: daily   family history includes Hypertension in her father.  ROS as above:  Medications: Current Outpatient Prescriptions  Medication Sig  Dispense Refill  . buPROPion (WELLBUTRIN XL) 300 MG 24 hr tablet Take 1 tablet by mouth  daily 90 tablet 1  . doxylamine, Sleep, (UNISOM) 25 MG tablet Take 25 mg by mouth at bedtime as needed.    . hydrochlorothiazide (HYDRODIURIL) 25 MG tablet Take 1 tablet (25 mg total) by mouth daily. 90 tablet 4  . sertraline (ZOLOFT) 100 MG tablet Take 1 tablet (100 mg total) by mouth daily. 90 tablet 1   No current facility-administered medications for this visit.    No Known Allergies  Health Maintenance Health Maintenance  Topic Date Due  . PAP SMEAR  11/08/2013  . TETANUS/TDAP  11/10/2019  . INFLUENZA VACCINE  Completed  . HIV Screening  Completed     Exam:  BP 129/66   Pulse 73   Wt 165 lb (74.8 kg)   BMI 27.46 kg/m  Gen: Well NAD HEENT: EOMI,  MMM Lungs: Normal work of breathing. CTABL Heart: RRR no MRG Abd: NABS, Soft. Nondistended, Nontender Exts: Brisk capillary refill, warm and well perfused.  Depression screen [facility] 2/9 09/01/2016 07/28/2016 07/14/2016  Decreased Interest 0 0 0  Down, Depressed, Hopeless 0 0 1  PHQ - 2 Score 0 0 1  Altered sleeping 1 2 3   Tired, decreased energy 1 3 3   Change in appetite 2 2 1   Feeling bad or  failure about yourself  0 2 1  Trouble concentrating 0 0 0  Moving slowly or fidgety/restless 0 0 0  Suicidal thoughts 0 0 0  PHQ-9 Score 4 9 9   Some encounter information is confidential and restricted. Go to Review Flowsheets activity to see all data.     No results found for this or any previous visit (from the past 72 hour(s)). No results found.    Assessment and Plan: 45 y.o. female with depression and HTN presenting for f/u after increased Zoloft dose.  Depression: Much improved subjectively and on PHQ 9. Unclear if changes in appetite/weight are related to medication. Continue Zoloft 100 mg & Wellbutrin 300 mg, follow up as needed or in 6-12 mos.  Hypertension: At goal. Continue current regimen.  Health maintenance: Obtain records  from OB/GYN.   No orders of the defined types were placed in this encounter.   Discussed warning signs or symptoms. Please see discharge instructions. Patient expresses understanding.

## 2016-09-01 NOTE — Patient Instructions (Signed)
Thank you for coming in today. Recheck in 6-12 months or sooner if needed.  We will keep an eye on weight gain.

## 2016-09-06 ENCOUNTER — Encounter: Payer: Self-pay | Admitting: Family Medicine

## 2016-09-13 ENCOUNTER — Ambulatory Visit (HOSPITAL_COMMUNITY): Payer: Self-pay | Admitting: Licensed Clinical Social Worker

## 2016-11-03 ENCOUNTER — Encounter: Payer: Self-pay | Admitting: Family Medicine

## 2016-11-28 ENCOUNTER — Encounter (HOSPITAL_COMMUNITY): Payer: Self-pay | Admitting: Licensed Clinical Social Worker

## 2016-12-01 ENCOUNTER — Encounter: Payer: Self-pay | Admitting: Family Medicine

## 2016-12-05 ENCOUNTER — Other Ambulatory Visit: Payer: Self-pay | Admitting: Physician Assistant

## 2016-12-07 ENCOUNTER — Other Ambulatory Visit: Payer: Self-pay | Admitting: *Deleted

## 2016-12-07 MED ORDER — PANTOPRAZOLE SODIUM 40 MG PO TBEC: 40.0000 mg | DELAYED_RELEASE_TABLET | Freq: Every day | ORAL | 0 refills | Status: DC

## 2017-01-30 ENCOUNTER — Other Ambulatory Visit: Payer: Self-pay | Admitting: Physician Assistant

## 2017-02-07 ENCOUNTER — Other Ambulatory Visit: Payer: Self-pay

## 2017-02-07 MED ORDER — SERTRALINE HCL 100 MG PO TABS
100.0000 mg | ORAL_TABLET | Freq: Every day | ORAL | 0 refills | Status: DC
Start: 2017-02-07 — End: 2017-02-16

## 2017-02-07 NOTE — Progress Notes (Signed)
Pt scheduled follow up appt.

## 2017-02-16 ENCOUNTER — Encounter: Payer: Self-pay | Admitting: Family Medicine

## 2017-02-16 ENCOUNTER — Ambulatory Visit (INDEPENDENT_AMBULATORY_CARE_PROVIDER_SITE_OTHER): Payer: 59 | Admitting: Family Medicine

## 2017-02-16 VITALS — BP 135/72 | HR 72 | Wt 175.0 lb

## 2017-02-16 DIAGNOSIS — F411 Generalized anxiety disorder: Secondary | ICD-10-CM

## 2017-02-16 DIAGNOSIS — F3342 Major depressive disorder, recurrent, in full remission: Secondary | ICD-10-CM | POA: Diagnosis not present

## 2017-02-16 DIAGNOSIS — I10 Essential (primary) hypertension: Secondary | ICD-10-CM | POA: Diagnosis not present

## 2017-02-16 MED ORDER — SERTRALINE HCL 100 MG PO TABS: 100.0000 mg | ORAL_TABLET | Freq: Every day | ORAL | 3 refills | Status: DC

## 2017-02-16 MED ORDER — PANTOPRAZOLE SODIUM 40 MG PO TBEC: 40.0000 mg | DELAYED_RELEASE_TABLET | Freq: Every day | ORAL | 3 refills | Status: DC

## 2017-02-16 MED ORDER — HYDROCHLOROTHIAZIDE 25 MG PO TABS: 25.0000 mg | ORAL_TABLET | Freq: Every day | ORAL | 3 refills | Status: DC

## 2017-02-16 MED ORDER — BUPROPION HCL ER (XL) 300 MG PO TB24: 300.0000 mg | ORAL_TABLET | Freq: Every day | ORAL | 3 refills | Status: DC

## 2017-02-16 NOTE — Patient Instructions (Signed)
Thank you for coming in today. Continue current medicines.  You should have kidney and vit D labs checked this Fall.  Your OBGYN can do this or I can.  You do not need to see me to get these labs done.  Just send me a message.  Please make sure your OBGYN send a copy of the visit, labs and mammogram results my way.  Otherwise if all is well we will check back in about 1 year or sooner if needed.

## 2017-02-16 NOTE — Progress Notes (Signed)
Anita Alexander is a 46 y.o. female who presents to [facility]: [place] today for follow-up hypertension and anxiety and insomnia. Patient is doing well taking hydrochlorothiazide for hypertension. She denies chest pain palpitations or shortness of breath.  She notes that her anxiety and insomnia is well controlled with Zoloft. She feels well with no adverse reactions to the medication.   Past Medical History:  Diagnosis Date  . Advanced maternal age (AMA), 40 years or greater 07/09/2014  . Anxiety    no meds currently  . Depression    no meds currently  . GERD (gastroesophageal reflux disease)   . Headache(784.0)    migraine  . HTN (hypertension)   . MVP (mitral valve prolapse)    no problems, no prior treatment for dental/surgical per pt  . MVP (mitral valve prolapse)   . S/P tubal ligation 07/10/2014  . SVD (spontaneous vaginal delivery)    x 2  . SVD (spontaneous vaginal delivery) 07/10/2014   Past Surgical History:  Procedure Laterality Date  . COLONOSCOPY    . DILATION AND CURETTAGE OF UTERUS     mab x 1  . FINGER SURGERY     cyst removed  . TUBAL LIGATION Bilateral 07/10/2014   Procedure: Post Partum Tubal Ligation, Bilateral Salpingectomy;  Surgeon: Janyth Contes, MD;  Location: [facility];  Service: Gynecology;  Laterality: Bilateral;  . WISDOM TOOTH EXTRACTION     Social History  Substance Use Topics  . Smoking status: Former Smoker    Packs/day: 1.00    Years: 7.00    Types: Cigarettes    Quit date: 11/07/1998  . Smokeless tobacco: Never Used  . Alcohol use No     Comment: daily   family history includes Hypertension in her father.  ROS as above:  Medications: Current Outpatient Prescriptions  Medication Sig Dispense Refill  . buPROPion (WELLBUTRIN XL) 300 MG 24 hr tablet Take 1 tablet (300 mg total) by mouth daily. 90 tablet 3  . doxylamine,  Sleep, (UNISOM) 25 MG tablet Take 25 mg by mouth at bedtime as needed.    . hydrochlorothiazide (HYDRODIURIL) 25 MG tablet Take 1 tablet (25 mg total) by mouth daily. 90 tablet 3  . pantoprazole (PROTONIX) 40 MG tablet Take 1 tablet (40 mg total) by mouth daily. 90 tablet 3  . sertraline (ZOLOFT) 100 MG tablet Take 1 tablet (100 mg total) by mouth daily. 90 tablet 3  . pantoprazole (PROTONIX) 40 MG tablet Take 1 tablet (40 mg total) by mouth daily. 90 tablet 3   No current facility-administered medications for this visit.    No Known Allergies  Health Maintenance Health Maintenance  Topic Date Due  . INFLUENZA VACCINE  06/07/2017  . MAMMOGRAM  08/30/2018  . PAP SMEAR  08/30/2021  . TETANUS/TDAP  04/15/2024  . HIV Screening  Completed     Exam:  BP 135/72   Pulse 72   Wt 175 lb (79.4 kg)   BMI 29.12 kg/m  Gen: Well NAD HEENT: EOMI,  MMM Lungs: Normal work of breathing. CTABL Heart: RRR no MRG Abd: NABS, Soft. Nondistended, Nontender Exts: Brisk capillary refill, warm and well perfused.  Psych alert and oriented normal speech thought process and affect.  Depression screen [facility] 2/9 02/16/2017 09/01/2016 07/28/2016 07/14/2016  Decreased Interest 0 0 0 0  Down, Depressed, Hopeless 0 0 0 1  PHQ - 2 Score 0 0 0 1  Altered sleeping -  1 2 3   Tired, decreased energy - 1 3 3   Change in appetite - 2 2 1   Feeling bad or failure about yourself  - 0 2 1  Trouble concentrating - 0 0 0  Moving slowly or fidgety/restless - 0 0 0  Suicidal thoughts - 0 0 0  PHQ-9 Score - 4 9 9   Some encounter information is confidential and restricted. Go to Review Flowsheets activity to see all data.      No results found for this or any previous visit (from the past 72 hour(s)). No results found.    Assessment and Plan: 46 y.o. female with  Hypertension reasonably well controlled continue current regimen. Recheck labs either at OB/GYN or in this office in September. Follow-up with me again in 1  year or sooner if needed.  Anxiety/depression/insomnia  doing well continue current regimen.   No orders of the defined types were placed in this encounter.  Meds ordered this encounter  Medications  . buPROPion (WELLBUTRIN XL) 300 MG 24 hr tablet    Sig: Take 1 tablet (300 mg total) by mouth daily.    Dispense:  90 tablet    Refill:  3  . hydrochlorothiazide (HYDRODIURIL) 25 MG tablet    Sig: Take 1 tablet (25 mg total) by mouth daily.    Dispense:  90 tablet    Refill:  3  . pantoprazole (PROTONIX) 40 MG tablet    Sig: Take 1 tablet (40 mg total) by mouth daily.    Dispense:  90 tablet    Refill:  3  . sertraline (ZOLOFT) 100 MG tablet    Sig: Take 1 tablet (100 mg total) by mouth daily.    Dispense:  90 tablet    Refill:  3     Discussed warning signs or symptoms. Please see discharge instructions. Patient expresses understanding.    CC: Dr Melba Coon @ [place].

## 2017-03-09 ENCOUNTER — Other Ambulatory Visit: Payer: Self-pay | Admitting: Family Medicine

## 2017-03-31 ENCOUNTER — Other Ambulatory Visit: Payer: Self-pay

## 2017-05-22 ENCOUNTER — Other Ambulatory Visit: Payer: Self-pay | Admitting: Physician Assistant

## 2017-10-02 ENCOUNTER — Encounter: Payer: Self-pay | Admitting: Family Medicine

## 2017-10-02 ENCOUNTER — Ambulatory Visit: Payer: 59 | Admitting: Family Medicine

## 2017-10-02 VITALS — BP 117/79 | HR 90 | Ht 65.0 in | Wt 191.0 lb

## 2017-10-02 DIAGNOSIS — R1011 Right upper quadrant pain: Secondary | ICD-10-CM | POA: Diagnosis not present

## 2017-10-02 DIAGNOSIS — R635 Abnormal weight gain: Secondary | ICD-10-CM | POA: Diagnosis not present

## 2017-10-02 DIAGNOSIS — R109 Unspecified abdominal pain: Secondary | ICD-10-CM | POA: Insufficient documentation

## 2017-10-02 DIAGNOSIS — R768 Other specified abnormal immunological findings in serum: Secondary | ICD-10-CM

## 2017-10-02 HISTORY — DX: Other specified abnormal immunological findings in serum: R76.8

## 2017-10-02 MED ORDER — DICYCLOMINE HCL 10 MG PO CAPS: 10.0000 mg | ORAL_CAPSULE | Freq: Three times a day (TID) | ORAL | 1 refills | Status: DC

## 2017-10-02 MED ORDER — HYDROCHLOROTHIAZIDE 25 MG PO TABS: 25.0000 mg | ORAL_TABLET | Freq: Every day | ORAL | 3 refills | Status: DC

## 2017-10-02 NOTE — Patient Instructions (Addendum)
Thank you for coming in today. Get labs and ultrasound soon.  Next step is CT scan.  Try Bentyl for spasm.   Irritable Bowel Syndrome, Adult Irritable bowel syndrome (IBS) is not one specific disease. It is a group of symptoms that affects the organs responsible for digestion (gastrointestinal or GI tract). To regulate how your GI tract works, your body sends signals back and forth between your intestines and your brain. If you have IBS, there may be a problem with these signals. As a result, your GI tract does not function normally. Your intestines may become more sensitive and overreact to certain things. This is especially true when you eat certain foods or when you are under stress. There are four types of IBS. These may be determined based on the consistency of your stool:  IBS with diarrhea.  IBS with constipation.  Mixed IBS.  Unsubtyped IBS.  It is important to know which type of IBS you have. Some treatments are more likely to be helpful for certain types of IBS. What are the causes? The exact cause of IBS is not known. What increases the risk? You may have a higher risk of IBS if:  You are a woman.  You are younger than 45 years old.  You have a family history of IBS.  You have mental health problems.  You have had bacterial infection of your GI tract.  What are the signs or symptoms? Symptoms of IBS vary from person to person. The main symptom is abdominal pain or discomfort. Additional symptoms usually include one or more of the following:  Diarrhea, constipation, or both.  Abdominal swelling or bloating.  Feeling full or sick after eating a small or regular-size meal.  Frequent gas.  Mucus in the stool.  A feeling of having more stool left after a bowel movement.  Symptoms tend to come and go. They may be associated with stress, psychiatric conditions, or nothing at all. How is this diagnosed? There is no specific test to diagnose IBS. Your health care  provider will make a diagnosis based on a physical exam, medical history, and your symptoms. You may have other tests to rule out other conditions that may be causing your symptoms. These may include:  Blood tests.  X-rays.  CT scan.  Endoscopy and colonoscopy. This is a test in which your GI tract is viewed with a long, thin, flexible tube.  How is this treated? There is no cure for IBS, but treatment can help relieve symptoms. IBS treatment often includes:  Changes to your diet, such as: ? Eating more fiber. ? Avoiding foods that cause symptoms. ? Drinking more water. ? Eating regular, medium-sized portioned meals.  Medicines. These may include: ? Fiber supplements if you have constipation. ? Medicine to control diarrhea (antidiarrheal medicines). ? Medicine to help control muscle spasms in your GI tract (antispasmodic medicines). ? Medicines to help with any mental health issues, such as antidepressants or tranquilizers.  Therapy. ? Talk therapy may help with anxiety, depression, or other mental health issues that can make IBS symptoms worse.  Stress reduction. ? Managing your stress can help keep symptoms under control.  Follow these instructions at home:  Take medicines only as directed by your health care provider.  Eat a healthy diet. ? Avoid foods and drinks with added sugar. ? Include more whole grains, fruits, and vegetables gradually into your diet. This may be especially helpful if you have IBS with constipation. ? Avoid any foods and drinks  that make your symptoms worse. These may include dairy products and caffeinated or carbonated drinks. ? Do not eat large meals. ? Drink enough fluid to keep your urine clear or pale yellow.  Exercise regularly. Ask your health care provider for recommendations of good activities for you.  Keep all follow-up visits as directed by your health care provider. This is important. Contact a health care provider if:  You have  constant pain.  You have trouble or pain with swallowing.  You have worsening diarrhea. Get help right away if:  You have severe and worsening abdominal pain.  You have diarrhea and: ? You have a rash, stiff neck, or severe headache. ? You are irritable, sleepy, or difficult to awaken. ? You are weak, dizzy, or extremely thirsty.  You have bright red blood in your stool or you have black tarry stools.  You have unusual abdominal swelling that is painful.  You vomit continuously.  You vomit blood (hematemesis).  You have both abdominal pain and a fever. This information is not intended to replace advice given to you by your health care provider. Make sure you discuss any questions you have with your health care provider. Document Released: 10/24/2005 Document Revised: 03/25/2016 Document Reviewed: 07/11/2014 Elsevier Interactive Patient Education  2018 [employer]  Dicyclomine tablets or capsules What is this medicine? DICYCLOMINE (dye SYE kloe meen) is used to treat bowel problems including irritable bowel syndrome. This medicine may be used for other purposes; ask your health care provider or pharmacist if you have questions. COMMON BRAND NAME(S): Bentyl What should I tell my health care provider before I take this medicine? They need to know if you have any of these conditions: -difficulty passing urine -esophagus problems or heartburn -glaucoma -heart disease, or previous heart attack -myasthenia gravis -prostate trouble -stomach infection, or obstruction -ulcerative colitis -an unusual or allergic reaction to dicyclomine, other medicines, foods, dyes, or preservatives -pregnant or trying to get pregnant -breast-feeding How should I use this medicine? Take this medicine by mouth with a glass of water. Follow the directions on the prescription label. It is best to take this medicine on an empty stomach, 30 minutes to 1 hour before meals. Take your medicine at  regular intervals. Do not take your medicine more often than directed. Talk to your pediatrician regarding the use of this medicine in children. Special care may be needed. While this drug may be prescribed for children as young as 22 months of age for selected conditions, precautions do apply. Patients over 9 years old may have a stronger reaction and need a smaller dose. Overdosage: If you think you have taken too much of this medicine contact a poison control center or emergency room at once. NOTE: This medicine is only for you. Do not share this medicine with others. What if I miss a dose? If you miss a dose, take it as soon as you can. If it is almost time for your next dose, take only that dose. Do not take double or extra doses. What may interact with this medicine? -amantadine -antacids -benztropine -digoxin -disopyramide -medicines for allergies, colds and breathing difficulties -medicines for alzheimer's disease -medicines for anxiety or sleeping problems -medicines for depression or psychotic disturbances -medicines for diarrhea -medicines for pain -metoclopramide -tegaserod This list may not describe all possible interactions. Give your health care provider a list of all the medicines, herbs, non-prescription drugs, or dietary supplements you use. Also tell them if you smoke, drink alcohol, or use illegal  drugs. Some items may interact with your medicine. What should I watch for while using this medicine? You may get drowsy, dizzy, or have blurred vision. Do not drive, use machinery, or do anything that needs mental alertness until you know how this medicine affects you. To reduce the risk of dizzy or fainting spells, do not sit or stand up quickly, especially if you are an older patient. Alcohol can make you more drowsy, avoid alcoholic drinks. Stay out of bright light and wear sunglasses if this medicine makes your eyes more sensitive to light. Avoid extreme heat (hot tubs,  saunas). This medicine can cause you to sweat less than normal. Your body temperature could increase to dangerous levels, which may lead to heat stroke. Antacids can stop this medicine from working. If you get an upset stomach and want to take an antacid, make sure there is an interval of at least 1 to 2 hours before or after you take this medicine. Your mouth may get dry. Chewing sugarless gum or sucking hard candy, and drinking plenty of water may help. Contact your doctor if the problem does not go away or is severe. What side effects may I notice from receiving this medicine? Side effects that you should report to your doctor or health care professional as soon as possible: -agitation, nervousness, confusion -difficulty swallowing -dizziness, drowsiness -fast or slow heartbeat -hallucinations -pain or difficulty passing urine Side effects that usually do not require medical attention (report to your doctor or health care professional if they continue or are bothersome): -constipation -headache -nausea or vomiting -sexual difficulty This list may not describe all possible side effects. Call your doctor for medical advice about side effects. You may report side effects to FDA at 1-800-FDA-1088. Where should I keep my medicine? Keep out of the reach of children. Store at room temperature below 30 degrees C (86 degrees F). Protect from light. Throw away any unused medicine after the expiration date. NOTE: This sheet is a summary. It may not cover all possible information. If you have questions about this medicine, talk to your doctor, pharmacist, or health care provider.  2018 Elsevier/Gold Standard (2008-02-12 17:12:34)

## 2017-10-02 NOTE — Progress Notes (Signed)
Anita Alexander is a 46 y.o. female who presents to [facility] [place] today for right sided abdominal pain.  Patient has been experiencing intermittent right sided abdominal pain for the past 4 months. Pain comes and goes throughout the day, but is problematic most days. Associated with diarrhea occasionally, but not always. Also experiences nausea with the pain occasionally. The pain extends from her back to RLQ. Pain also present in RUQ just below costal margin. Patient states that the pain is dull in nature. It is not associated with particular foods, eating patterns, or time of day.   Patient denies fevers. She has had blood in her stools, but states bleeding was due to hemorrhoids and has been similar in the past. No vaginal discharge or abnormal bleeding. Patient has not had abdominal surgery.  Had extensive bilary workup including HIDA scan 4 years ago. Ultimately diagnosed with IBS. Patient does state that symptoms appear to correlate with stress of last several months.   Health Maintenance:  Otherwise, patient does not have complaints. Patient has been drinking 3-4 beers nightly recently, and she is unsure whether this may be contributing to pain. States that she is in control of her drinking and intends to cut back, especially if it is contributing to recent weight gain.   States that life has been stressful recently, but her Zoloft seems to be helping with her anxiety. She is amenable to staying on Zoloft moving forward.    Past Medical History:  Diagnosis Date  . Advanced maternal age (AMA), 40 years or greater 07/09/2014  . Anxiety    no meds currently  . Depression    no meds currently  . GERD (gastroesophageal reflux disease)   . Headache(784.0)    migraine  . HTN (hypertension)   . MVP (mitral valve prolapse)    no problems, no prior treatment for  dental/surgical per pt  . MVP (mitral valve prolapse)   . S/P tubal ligation 07/10/2014  . SVD (spontaneous vaginal delivery)    x 2  . SVD (spontaneous vaginal delivery) 07/10/2014   Past Surgical History:  Procedure Laterality Date  . COLONOSCOPY    . DILATION AND CURETTAGE OF UTERUS     mab x 1  . FINGER SURGERY     cyst removed  . TUBAL LIGATION Bilateral 07/10/2014   Procedure: Post Partum Tubal Ligation, Bilateral Salpingectomy;  Surgeon: Janyth Contes, MD;  Location: [facility];  Service: Gynecology;  Laterality: Bilateral;  . WISDOM TOOTH EXTRACTION     Social History   Tobacco Use  . Smoking status: Former Smoker    Packs/day: 1.00    Years: 7.00    Pack years: 7.00    Types: Cigarettes    Last attempt to quit: 11/07/1998    Years since quitting: 18.9  . Smokeless tobacco: Never Used  Substance Use Topics  . Alcohol use: No    Alcohol/week: 0.0 oz    Comment: daily   family history includes Hyperlipidemia in her unknown relative; Hypertension in her father.  ROS as above:  Medications: Current Outpatient Medications  Medication Sig Dispense Refill  . buPROPion (WELLBUTRIN XL) 300 MG 24 hr tablet TAKE 1 TABLET BY MOUTH  DAILY 90 tablet 1  . doxylamine, Sleep, (UNISOM) 25 MG tablet Take 25 mg by mouth at bedtime as needed.    . hydrochlorothiazide (HYDRODIURIL) 25 MG tablet Take 1 tablet (25 mg total) by mouth daily. [place]  tablet 3  . pantoprazole (PROTONIX) 40 MG tablet Take 1 tablet (40 mg total) by mouth daily. 90 tablet 3  . sertraline (ZOLOFT) 100 MG tablet Take 1 tablet (100 mg total) by mouth daily. 90 tablet 3  . dicyclomine (BENTYL) 10 MG capsule Take 1 capsule (10 mg total) by mouth 4 (four) times daily -  before meals and at bedtime. 120 capsule 1   No current facility-administered medications for this visit.    No Known Allergies  Health Maintenance Health Maintenance  Topic Date Due  . INFLUENZA VACCINE  06/07/2017  . MAMMOGRAM  08/30/2018  . PAP  SMEAR  08/30/2021  . TETANUS/TDAP  04/15/2024  . HIV Screening  Completed     Exam:  BP 117/79   Pulse 90   Ht 5\' 5"  (1.651 m)   Wt 191 lb (86.6 kg)   BMI 31.78 kg/m  Gen: Well NAD HEENT: EOMI,  MMM Lungs: Normal work of breathing. CTABL Heart: RRR no MRG Abd: NABS, Soft. Nondistended. Tender to palpation primarily in RUQ. Some pain in LLQ as well. Murphy's negative. No hepatosplenomegaly.  Exts: Brisk capillary refill, warm and well perfused.    No results found for this or any previous visit (from the past 72 hour(s)). No results found.    Assessment and Plan: 46 y.o. female with 4 months history of right sided abdominal pain. IBS is most likely, but need to rule out hepatic, bilary, and kidney etiologies.   Patient amenable to work-up to rule out organic etiologies. Patient understanding that likely functional in nature. Will prescribe dicyclomine for IBS while initiating work-up.   We will order an [place] of her abdomen as well as CBC, CMP, lipase, hepatitis panel. Will consider CT abdomen if patient is not improving and initial work-up is negative.    Orders Placed This Encounter  Procedures  . US Abdomen Complete    Standing Status:   Future    Standing Expiration Date:   12/02/2018    Order Specific Question:   Reason for Exam (SYMPTOM  OR DIAGNOSIS REQUIRED)    Answer:   eval RUQ abd pain    Order Specific Question:   Preferred imaging location?    Answer:   [facility]  . CBC  . COMPLETE METABOLIC PANEL WITH GFR  . Lipase  . Hepatitis C antibody  . Hepatitis B surface antibody  . Hepatitis B surface antigen   Meds ordered this encounter  Medications  . hydrochlorothiazide (HYDRODIURIL) 25 MG tablet    Sig: Take 1 tablet (25 mg total) by mouth daily.    Dispense:  90 tablet    Refill:  3  . dicyclomine (BENTYL) 10 MG capsule    Sig: Take 1 capsule (10 mg total) by mouth 4 (four) times daily -  before meals and at bedtime.    Dispense:  120  capsule    Refill:  1     Discussed warning signs or symptoms. Please see discharge instructions. Patient expresses understanding.

## 2017-10-03 ENCOUNTER — Encounter: Payer: Self-pay | Admitting: Family Medicine

## 2017-10-03 DIAGNOSIS — R1084 Generalized abdominal pain: Secondary | ICD-10-CM

## 2017-10-03 LAB — CBC
HEMATOCRIT: 37.5 % (ref 35.0–45.0)
HEMOGLOBIN: 12.9 g/dL (ref 11.7–15.5)
MCH: 30.8 pg (ref 27.0–33.0)
MCHC: 34.4 g/dL (ref 32.0–36.0)
MCV: 89.5 fL (ref 80.0–100.0)
MPV: 9.9 fL (ref 7.5–12.5)
Platelets: 301 10*3/uL (ref 140–400)
RBC: 4.19 10*6/uL (ref 3.80–5.10)
RDW: 12.2 % (ref 11.0–15.0)
WBC: 8.1 10*3/uL (ref 3.8–10.8)

## 2017-10-03 LAB — HEPATITIS C ANTIBODY
Hepatitis C Ab: NONREACTIVE
SIGNAL TO CUT-OFF: 0.03 (ref <1.00)

## 2017-10-03 LAB — COMPLETE METABOLIC PANEL WITH GFR
AG RATIO: 1.8 (calc) (ref 1.0–2.5)
ALBUMIN MSPROF: 4.3 g/dL (ref 3.6–5.1)
ALKALINE PHOSPHATASE (APISO): 52 U/L (ref 33–115)
ALT: 13 U/L (ref 6–29)
AST: 13 U/L (ref 10–35)
BILIRUBIN TOTAL: 0.3 mg/dL (ref 0.2–1.2)
BUN: 10 mg/dL (ref 7–25)
CHLORIDE: 102 mmol/L (ref 98–110)
CO2: 27 mmol/L (ref 20–32)
Calcium: 9.3 mg/dL (ref 8.6–10.2)
Creat: 0.74 mg/dL (ref 0.50–1.10)
GFR, EST AFRICAN AMERICAN: 113 mL/min/{1.73_m2} (ref >=60)
GFR, Est Non African American: 98 mL/min/{1.73_m2} (ref >=60)
GLOBULIN: 2.4 g/dL (ref 1.9–3.7)
Glucose, Bld: 102 mg/dL — ABNORMAL HIGH (ref 65–99)
POTASSIUM: 3.6 mmol/L (ref 3.5–5.3)
Sodium: 138 mmol/L (ref 135–146)
Total Protein: 6.7 g/dL (ref 6.1–8.1)

## 2017-10-03 LAB — HEPATITIS B SURFACE ANTIBODY, QUANTITATIVE: Hepatitis B-Post: <5 m[IU]/mL — ABNORMAL LOW (ref >=10)

## 2017-10-03 LAB — HEPATITIS B SURFACE ANTIGEN: Hepatitis B Surface Ag: NONREACTIVE

## 2017-10-03 LAB — LIPASE: Lipase: 16 U/L (ref 7–60)

## 2017-10-04 ENCOUNTER — Ambulatory Visit (INDEPENDENT_AMBULATORY_CARE_PROVIDER_SITE_OTHER): Payer: 59

## 2017-10-04 DIAGNOSIS — R1011 Right upper quadrant pain: Secondary | ICD-10-CM

## 2018-02-19 ENCOUNTER — Ambulatory Visit: Payer: 59 | Admitting: Family Medicine

## 2018-02-19 ENCOUNTER — Encounter: Payer: Self-pay | Admitting: Family Medicine

## 2018-02-19 VITALS — BP 135/89 | HR 86 | Temp 98.7°F | Ht 65.0 in | Wt 197.0 lb

## 2018-02-19 DIAGNOSIS — F909 Attention-deficit hyperactivity disorder, unspecified type: Secondary | ICD-10-CM | POA: Diagnosis not present

## 2018-02-19 DIAGNOSIS — R42 Dizziness and giddiness: Secondary | ICD-10-CM

## 2018-02-19 DIAGNOSIS — F411 Generalized anxiety disorder: Secondary | ICD-10-CM

## 2018-02-19 DIAGNOSIS — R454 Irritability and anger: Secondary | ICD-10-CM | POA: Diagnosis not present

## 2018-02-19 DIAGNOSIS — F3341 Major depressive disorder, recurrent, in partial remission: Secondary | ICD-10-CM

## 2018-02-19 DIAGNOSIS — R5382 Chronic fatigue, unspecified: Secondary | ICD-10-CM | POA: Diagnosis not present

## 2018-02-19 MED ORDER — PANTOPRAZOLE SODIUM 40 MG PO TBEC: 40.0000 mg | DELAYED_RELEASE_TABLET | Freq: Every day | ORAL | 3 refills | Status: DC

## 2018-02-19 MED ORDER — AMPHETAMINE-DEXTROAMPHETAMINE 10 MG PO TABS: 10.0000 mg | ORAL_TABLET | Freq: Two times a day (BID) | ORAL | 0 refills | Status: DC

## 2018-02-19 MED ORDER — BUPROPION HCL ER (XL) 300 MG PO TB24: 300.0000 mg | ORAL_TABLET | Freq: Every day | ORAL | 1 refills | Status: DC

## 2018-02-19 MED ORDER — SERTRALINE HCL 100 MG PO TABS: 100.0000 mg | ORAL_TABLET | Freq: Every day | ORAL | 3 refills | Status: DC

## 2018-02-19 NOTE — Progress Notes (Signed)
Anita Alexander is a 47 y.o. female who presents to [facility]: [place] today for trouble concentrating, depression, hypertension, vertigo.  Trouble concentrating: [place] has a history of ADHD in college.  She notes that during college she benefited from methylphenidate.  She notes this allowed her to complete tasks and avoid procrastination.  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  She wonders if she would benefit from treatment for adult ADHD.  Additionally she notes continued depression and anxiety symptoms.  This have been typically pretty well controlled with Zoloft and Wellbutrin.  She notes she does have difficulty concentrating recently as well as fatigue and feeling tired or having poor energy.  She is not sure if this is ADHD or depression.  Additionally she notes vertigo.  This is been ongoing for years and is worse with head motion and with jumping or seeing things that are moving rapidly across her vision like for example a movie theater.  This causes dizziness and room spinning sensation at times.  She is never had much treatment for this.  She denies any head injury.     Past Medical History:  Diagnosis Date  . Advanced maternal age (AMA), 40 years or greater 07/09/2014  . Anxiety    no meds currently  . Depression    no meds currently  . GERD (gastroesophageal reflux disease)   . Headache(784.0)    migraine  . HTN (hypertension)   . MVP (mitral valve prolapse)    no problems, no prior treatment for dental/surgical per pt  . MVP (mitral valve prolapse)   . S/P tubal ligation 07/10/2014  . SVD (spontaneous vaginal delivery)    x 2  . SVD (spontaneous vaginal delivery) 07/10/2014   Past Surgical  History:  Procedure Laterality Date  . COLONOSCOPY    . DILATION AND CURETTAGE OF UTERUS     mab x 1  . FINGER SURGERY     cyst removed  . TUBAL LIGATION Bilateral 07/10/2014   Procedure: Post Partum Tubal Ligation, Bilateral Salpingectomy;  Surgeon: Janyth Contes, MD;  Location: [facility];  Service: Gynecology;  Laterality: Bilateral;  . WISDOM TOOTH EXTRACTION     Social History   Tobacco Use  . Smoking status: Former Smoker    Packs/day: 1.00    Years: 7.00    Pack years: 7.00    Types: Cigarettes    Last attempt to quit: 11/07/1998    Years since quitting: 19.2  . Smokeless tobacco: Never Used  Substance Use Topics  . Alcohol use: No    Alcohol/week: 0.0 oz    Comment: daily   family history includes Hyperlipidemia in her unknown relative; Hypertension in her father.  ROS as above:  Medications: Current Outpatient Medications  Medication Sig Dispense Refill  . amphetamine-dextroamphetamine (ADDERALL) 10 MG tablet Take 1 tablet (10 mg total) by mouth 2 (two) times daily. 60 tablet 0  . buPROPion (WELLBUTRIN XL) 300 MG 24 hr tablet Take 1 tablet (300 mg total) by mouth daily. 90 tablet 1  . doxylamine, Sleep, (UNISOM) 25 MG tablet Take 25 mg by mouth at bedtime as needed.    . hydrochlorothiazide (HYDRODIURIL) 25 MG tablet Take 1 tablet (25 mg total) by mouth daily. 90 tablet 3  . pantoprazole (PROTONIX)  40 MG tablet Take 1 tablet (40 mg total) by mouth daily. 90 tablet 3  . sertraline (ZOLOFT) 100 MG tablet Take 1 tablet (100 mg total) by mouth daily. 90 tablet 3   No current facility-administered medications for this visit.    No Known Allergies  Health Maintenance Health Maintenance  Topic Date Due  . INFLUENZA VACCINE  06/07/2018  . MAMMOGRAM  08/30/2018  . PAP SMEAR  08/30/2021  . TETANUS/TDAP  04/15/2024  . HIV Screening  Completed     Exam:  BP 135/89   Pulse 86   Temp 98.7 F (37.1 C) (Oral)   Ht 5\' 5"  (1.651 m)   Wt 197 lb (89.4 kg)   BMI  32.78 kg/m  Gen: Well NAD HEENT: EOMI,  MMM  Lungs: Normal work of breathing. CTABL Heart: RRR no MRG Abd: NABS, Soft. Nondistended, Nontender Exts: Brisk capillary refill, warm and well perfused.  Psych: Alert and oriented normal speech thought process and affect.  No SI or HI expressed. Neuro alert and oriented normal coordination balance and gait.  No nystagmus.      Assessment and Plan: 47 y.o. female with  Adult ADHD: Treatment with immediate release Adderall.  Recheck in 1 month.  Adjust dosing as needed.  Depression and anxiety: Depression symptoms not particularly well controlled.  I am not sure how much of this is adult ADHD.  Plan for trial of Adderall and recheck in 1 month if not much better would consider increasing Wellbutrin.  Vertigo: Unclear etiology.  Plan for trial of referral to vestibular physical therapy and recheck in 1 month.  May benefit from further workup in the future if needed.  Hypertension: Borderline control.  Continue hydrochlorothiazide did her adjusting the dose if needed in the future.  Protonix refilled   Orders Placed This Encounter  Procedures  . Ambulatory referral to Physical Therapy    Referral Priority:   Routine    Referral Type:   Physical Medicine    Referral Reason:   Specialty Services Required    Requested Specialty:   Physical Therapy   Meds ordered this encounter  Medications  . amphetamine-dextroamphetamine (ADDERALL) 10 MG tablet    Sig: Take 1 tablet (10 mg total) by mouth 2 (two) times daily.    Dispense:  60 tablet    Refill:  0  . sertraline (ZOLOFT) 100 MG tablet    Sig: Take 1 tablet (100 mg total) by mouth daily.    Dispense:  90 tablet    Refill:  3  . buPROPion (WELLBUTRIN XL) 300 MG 24 hr tablet    Sig: Take 1 tablet (300 mg total) by mouth daily.    Dispense:  90 tablet    Refill:  1  . pantoprazole (PROTONIX) 40 MG tablet    Sig: Take 1 tablet (40 mg total) by mouth daily.    Dispense:  90 tablet     Refill:  3     Discussed warning signs or symptoms. Please see discharge instructions. Patient expresses understanding.

## 2018-02-19 NOTE — Patient Instructions (Signed)
Thank you for coming in today. Continue current medicines.  Start adderall and recheck in 1 month.  Pay attention to challenging tasks.   Amphetamine; Dextroamphetamine tablets What is this medicine? AMPHETAMINE; DEXTROAMPHETAMINE(am FET a meen; dex troe am FET a meen) is used to treat attention-deficit hyperactivity disorder (ADHD). It may also be used for narcolepsy. Federal law prohibits giving this medicine to any person other than the person for whom it was prescribed. Do not share this medicine with anyone else. This medicine may be used for other purposes; ask your health care provider or pharmacist if you have questions. COMMON BRAND NAME(S): Adderall What should I tell my health care provider before I take this medicine? They need to know if you have any of these conditions: -anxiety or panic attacks -circulation problems in fingers and toes -glaucoma -hardening or blockages of the arteries or heart blood vessels -heart disease or a heart defect -high blood pressure -history of a drug or alcohol abuse problem -history of stroke -kidney disease -liver disease -mental illness -seizures -suicidal thoughts, plans, or attempt; a previous suicide attempt by you or a family member -thyroid disease -Tourette's syndrome -an unusual or allergic reaction to dextroamphetamine, other amphetamines, other medicines, foods, dyes, or preservatives -pregnant or trying to get pregnant -breast-feeding How should I use this medicine? Take this medicine by mouth with a glass of water. Follow the directions on the prescription label. Take your doses at regular intervals. Do not take your medicine more often than directed. Do not suddenly stop your medicine. You must gradually reduce the dose or you may feel withdrawal effects. Ask your doctor or health care professional for advice. Talk to your pediatrician regarding the use of this medicine in children. Special care may be needed. While this drug  may be prescribed for children as young as 3 years for selected conditions, precautions do apply. Overdosage: If you think you have taken too much of this medicine contact a poison control center or emergency room at once. NOTE: This medicine is only for you. Do not share this medicine with others. What if I miss a dose? If you miss a dose, take it as soon as you can. If it is almost time for your next dose, take only that dose. Do not take double or extra doses. What may interact with this medicine? Do not take this medicine with any of the following medications: -MAOIS like Carbex, Eldepryl, Marplan, Nardil, and Parnate -other stimulant medicines for attention disorders, weight loss, or to stay awake This medicine may also interact with the following medications: -acetazolamide -ammonium chloride -antacids -ascorbic acid -atomoxetine -caffeine -certain medicines for blood pressure -certain medicines for depression, anxiety, or psychotic disturbances -certain medicines for seizures like carbamazepine, phenobarbital, phenytoin -certain medicines for stomach problems like cimetidine, famotidine, omeprazole, lansoprazole -cold or allergy medicines -glutamic acid -lithium -meperidine -methenamine; sodium acid phosphate -narcotic medicines for pain -norepinephrine -phenothiazines like chlorpromazine, mesoridazine, prochlorperazine, thioridazine -sodium acid phosphate -sodium bicarbonate This list may not describe all possible interactions. Give your health care provider a list of all the medicines, herbs, non-prescription drugs, or dietary supplements you use. Also tell them if you smoke, drink alcohol, or use illegal drugs. Some items may interact with your medicine. What should I watch for while using this medicine? Visit your doctor or health care professional for regular checks on your progress. This prescription requires that you follow special procedures with your doctor and  pharmacy. You will need to have a new written prescription  from your doctor every time you need a refill. This medicine may affect your concentration, or hide signs of tiredness. Until you know how this medicine affects you, do not drive, ride a bicycle, use machinery, or do anything that needs mental alertness. Tell your doctor or health care professional if this medicine loses its effects, or if you feel you need to take more than the prescribed amount. Do not change the dosage without talking to your doctor or health care professional. Decreased appetite is a common side effect when starting this medicine. Eating small, frequent meals or snacks can help. Talk to your doctor if you continue to have poor eating habits. Height and weight growth of a child taking this medicine will be monitored closely. Do not take this medicine close to bedtime. It may prevent you from sleeping. If you are going to need surgery, a MRI, CT scan, or other procedure, tell your doctor that you are taking this medicine. You may need to stop taking this medicine before the procedure. Tell your doctor or healthcare professional right away if you notice unexplained wounds on your fingers and toes while taking this medicine. You should also tell your healthcare provider if you experience numbness or pain, changes in the skin color, or sensitivity to temperature in your fingers or toes. What side effects may I notice from receiving this medicine? Side effects that you should report to your doctor or health care professional as soon as possible: -allergic reactions like skin rash, itching or hives, swelling of the face, lips, or tongue -changes in vision -chest pain or chest tightness -confusion, trouble speaking or understanding -fast, irregular heartbeat -fingers or toes feel numb, cool, painful -hallucination, loss of contact with reality -high blood pressure -males: prolonged or painful erection -seizures -severe  headaches -shortness of breath -suicidal thoughts or other mood changes -trouble walking, dizziness, loss of balance or coordination -uncontrollable head, mouth, neck, arm, or leg movements Side effects that usually do not require medical attention (report to your doctor or health care professional if they continue or are bothersome): -anxious -headache -loss of appetite -nausea, vomiting -trouble sleeping -weight loss This list may not describe all possible side effects. Call your doctor for medical advice about side effects. You may report side effects to FDA at 1-800-FDA-1088. Where should I keep my medicine? Keep out of the reach of children. This medicine can be abused. Keep your medicine in a safe place to protect it from theft. Do not share this medicine with anyone. Selling or giving away this medicine is dangerous and against the law. Store at room temperature between 15 and 30 degrees C (59 and 86 degrees F). Keep container tightly closed. Throw away any unused medicine after the expiration date. Dispose of properly. This medicine may cause accidental overdose and death if it is taken by other adults, children, or pets. Mix any unused medicine with a substance like cat litter or coffee grounds. Then throw the medicine away in a sealed container like a sealed bag or a coffee can with a lid. Do not use the medicine after the expiration date. NOTE: This sheet is a summary. It may not cover all possible information. If you have questions about this medicine, talk to your doctor, pharmacist, or health care provider.  2018 Elsevier/Gold Standard (2014-08-27 18:44:41)

## 2018-03-19 ENCOUNTER — Ambulatory Visit: Payer: Self-pay | Admitting: Family Medicine

## 2018-03-20 ENCOUNTER — Encounter: Payer: Self-pay | Admitting: Family Medicine

## 2018-06-30 ENCOUNTER — Emergency Department (INDEPENDENT_AMBULATORY_CARE_PROVIDER_SITE_OTHER)
Admission: EM | Admit: 2018-06-30 | Discharge: 2018-06-30 | Disposition: A | Discharge status: Home / Self Care | Payer: 59 | Source: Home / Self Care | Attending: Family Medicine | Admitting: Family Medicine

## 2018-06-30 ENCOUNTER — Encounter: Payer: Self-pay | Admitting: Emergency Medicine

## 2018-06-30 ENCOUNTER — Emergency Department (INDEPENDENT_AMBULATORY_CARE_PROVIDER_SITE_OTHER): Payer: 59

## 2018-06-30 ENCOUNTER — Other Ambulatory Visit: Payer: Self-pay

## 2018-06-30 DIAGNOSIS — M79672 Pain in left foot: Secondary | ICD-10-CM | POA: Diagnosis not present

## 2018-06-30 DIAGNOSIS — G8929 Other chronic pain: Secondary | ICD-10-CM

## 2018-06-30 DIAGNOSIS — M7732 Calcaneal spur, left foot: Secondary | ICD-10-CM

## 2018-06-30 DIAGNOSIS — M722 Plantar fascial fibromatosis: Secondary | ICD-10-CM

## 2018-06-30 HISTORY — DX: Plantar fascial fibromatosis: M72.2

## 2018-06-30 NOTE — ED Provider Notes (Signed)
[facility]    CSN: [id number] Arrival date & time: 06/30/18  1553     History   Chief Complaint Chief Complaint  Patient presents with  . Foot Pain    HPI Anita Alexander is a 47 y.o. female.   HPI  Anita Alexander is a 47 y.o. female presenting to UC with c/o 3 months of intermittent heel pain that is aching and sharp at times, moderate in severity. She has tried different shoes, gentle stretches, ibuprofen and ice but no relief. No known injury. Pain worsened last night while she was at a concert due to standing all night.    Past Medical History:  Diagnosis Date  . Advanced maternal age (AMA), 40 years or greater 07/09/2014  . Anxiety    no meds currently  . Depression    no meds currently  . GERD (gastroesophageal reflux disease)   . Headache(784.0)    migraine  . HTN (hypertension)   . MVP (mitral valve prolapse)    no problems, no prior treatment for dental/surgical per pt  . MVP (mitral valve prolapse)   . Plantar fasciitis   . S/P tubal ligation 07/10/2014  . SVD (spontaneous vaginal delivery)    x 2  . SVD (spontaneous vaginal delivery) 07/10/2014    Patient Active Problem List   Diagnosis Date Noted  . Vertigo 02/19/2018  . Adult ADHD 02/19/2018  . Abdominal pain 10/02/2017  . 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 IBS (irritable bowel syndrome) 12/16/2015  . Abnormal weight gain 12/16/2015  . S/P tubal ligation 07/10/2014  . GAD (generalized anxiety disorder) 02/08/2013  . IRRITABILITY 10/26/2010  . IRON DEFICIENCY 06/19/2010  . UNSPECIFIED VITAMIN D DEFICIENCY 06/16/2010  . Depression 06/16/2010  . ESOPHAGEAL REFLUX 06/16/2010  . Fatigue 06/16/2010  . HTN (hypertension) 06/16/2010    Past Surgical History:  Procedure Laterality Date  . COLONOSCOPY    . DILATION AND CURETTAGE OF UTERUS     mab x 1  . FINGER SURGERY     cyst removed  . TUBAL LIGATION Bilateral 07/10/2014   Procedure: Post Partum Tubal Ligation,  Bilateral Salpingectomy;  Surgeon: Janyth Contes, MD;  Location: [facility];  Service: Gynecology;  Laterality: Bilateral;  . WISDOM TOOTH EXTRACTION      OB History    Gravida  7   Para  3   Term  3   Preterm      AB  4   Living  3     SAB  4   TAB      Ectopic      Multiple      Live Births  3            Home Medications    Prior to Admission medications   Medication Sig Start Date End Date Taking? Authorizing Provider  amphetamine-dextroamphetamine (ADDERALL) 10 MG tablet Take 1 tablet (10 mg total) by mouth 2 (two) times daily. 02/19/18   Gregor Hams, MD  buPROPion (WELLBUTRIN XL) 300 MG 24 hr tablet Take 1 tablet (300 mg total) by mouth daily. 02/19/18   Gregor Hams, MD  doxylamine, Sleep, (UNISOM) 25 MG tablet Take 25 mg by mouth at bedtime as needed.    [provider]  hydrochlorothiazide (HYDRODIURIL) 25 MG tablet Take 1 tablet (25 mg total) by mouth daily. 10/02/17   Gregor Hams, MD  pantoprazole (PROTONIX) 40 MG tablet Take 1 tablet (40 mg total)  by mouth daily. 02/19/18 02/19/19  Gregor Hams, MD  sertraline (ZOLOFT) 100 MG tablet Take 1 tablet (100 mg total) by mouth daily. 02/19/18   Gregor Hams, MD    Family History Family History  Problem Relation Age of Onset  . Hypertension Father   . Hyperlipidemia Unknown     Social History Social History   Tobacco Use  . Smoking status: Former Smoker    Packs/day: 1.00    Years: 7.00    Pack years: 7.00    Types: Cigarettes    Last attempt to quit: 11/07/1998    Years since quitting: 19.6  . Smokeless tobacco: Never Used  Substance Use Topics  . Alcohol use: No    Alcohol/week: 0.0 standard drinks    Comment: daily  . Drug use: No     Allergies   Patient has no known allergies.   Review of Systems Review of Systems  Musculoskeletal: Positive for arthralgias and myalgias. Negative for joint swelling.  Skin: Negative for color change and wound.  Neurological: Negative  for weakness and numbness.     Physical Exam Triage Vital Signs ED Triage Vitals [06/30/18 1606]  Enc Vitals Group     BP (!) 156/87     Pulse Rate 82     Resp      Temp 98 F (36.7 C)     Temp Source Oral     SpO2 99 %     Weight 201 lb (91.2 kg)     Height 5\' 5"  (1.651 m)     Head Circumference      Peak Flow      Pain Score 4     Pain Loc      Pain Edu?      Excl. in [place]?    No data found.  Updated Vital Signs BP (!) 156/87 (BP Location: Left Arm)   Pulse 82   Temp 98 F (36.7 C) (Oral)   Ht 5\' 5"  (1.651 m)   Wt 201 lb (91.2 kg)   LMP 05/07/2018 (Exact Date)   SpO2 99%   BMI 33.45 kg/m   Visual Acuity Right Eye Distance:   Left Eye Distance:   Bilateral Distance:    Right Eye Near:   Left Eye Near:    Bilateral Near:     Physical Exam  Constitutional: She is oriented to person, place, and time. She appears well-developed and well-nourished.  HENT:  Head: Normocephalic and atraumatic.  Eyes: EOM are normal.  Neck: Normal range of motion.  Cardiovascular: Normal rate.  Pulses:      Dorsalis pedis pulses are 2+ on the left side.  Pulmonary/Chest: Effort normal.  Musculoskeletal: Normal range of motion. She exhibits tenderness. She exhibits no edema.  Left ankle and foot: no edema or deformity. Tenderness to posterior and plantar aspect of heel. Full ROM ankle and toes. No tenderness to the achilles tendon.   Neurological: She is alert and oriented to person, place, and time.  Skin: Skin is warm and dry. Capillary refill takes less than 2 seconds.  Left ankle and foot: skin in tact. No ecchymosis or erythema.   Psychiatric: She has a normal mood and affect. Her behavior is normal.  Nursing note and vitals reviewed.    UC Treatments / Results  Labs (all labs ordered are listed, but only abnormal results are displayed) Labs Reviewed - No data to display  EKG None  Radiology Dg Foot Complete Left  Result Date:  06/30/2018 CLINICAL DATA:  Left  heel pain x3 months EXAM: LEFT FOOT - COMPLETE 3+ VIEW COMPARISON:  None. FINDINGS: No fracture or dislocation is seen. The joint spaces are preserved. The visualized soft tissues are unremarkable. Small plantar and posterior calcaneal enthesopathy. IMPRESSION: No acute osseus abnormality is seen. Small plantar and posterior calcaneal enthesophytes. Electronically Signed   By: Julian Hy M.D.   On: 06/30/2018 16:40    Procedures Procedures (including critical care time)  Medications Ordered in UC Medications - No data to display  Initial Impression / Assessment and Plan / UC Course  I have reviewed the triage vital signs and the nursing notes.  Pertinent labs & imaging results that were available during my care of the patient were reviewed by me and considered in my medical decision making (see chart for details).     Hx and exam most c/w plantar fascitis  Offered heel cups, pt would like to purchase them OTC. Pt info packet for home stretches provided.  Final Clinical Impressions(s) / UC Diagnoses   Final diagnoses:  Chronic heel pain, left  Heel spur, left  Plantar fasciitis of left foot     Discharge Instructions      You may want to try over the counter heel cups or shoe inserts to help with your chronic heel pain.   Please see additional information about home care including foot stretches in this packet. If not improving in 1-2 weeks, it is recommended that you follow up with Sports Medicine for further evaluation and treatment.     ED Prescriptions    None     Controlled Substance Prescriptions [place] Controlled Substance Registry consulted? Not Applicable   Tyrell Antonio 06/30/18 1722

## 2018-06-30 NOTE — ED Triage Notes (Signed)
Pt c/o of left heel pain x3 months. States she was at a concert last night when the pain worsened. She denies injury

## 2018-06-30 NOTE — Discharge Instructions (Signed)
°  You may want to try over the counter heel cups or shoe inserts to help with your chronic heel pain.   Please see additional information about home care including foot stretches in this packet. If not improving in 1-2 weeks, it is recommended that you follow up with Sports Medicine for further evaluation and treatment.

## 2018-07-03 ENCOUNTER — Encounter: Payer: Self-pay | Admitting: Family Medicine

## 2018-07-03 ENCOUNTER — Ambulatory Visit (INDEPENDENT_AMBULATORY_CARE_PROVIDER_SITE_OTHER): Payer: 59 | Admitting: Family Medicine

## 2018-07-03 VITALS — BP 131/51 | HR 74 | Ht 65.0 in | Wt 200.0 lb

## 2018-07-03 DIAGNOSIS — M722 Plantar fascial fibromatosis: Secondary | ICD-10-CM | POA: Diagnosis not present

## 2018-07-03 DIAGNOSIS — M7662 Achilles tendinitis, left leg: Secondary | ICD-10-CM | POA: Diagnosis not present

## 2018-07-03 MED ORDER — NITROGLYCERIN 0.2 MG/HR TD PT24: MEDICATED_PATCH | TRANSDERMAL | 1 refills | Status: DC

## 2018-07-03 NOTE — Progress Notes (Signed)
Arlayne Anita Alexander is a 47 y.o. female who presents to [facility] today for foot pain.  Ayven has a 55-month history of left heel pain.  She notes pain at the plantar and posterior calcaneus.  She denies any injury.  She has been self treating at home for plantar fasciitis with a night splint and some stretching and icing exercises.  Her pain is most predominant at the plantar aspect of the foot.  She notes pain is worse when she gets out of bed in the morning and with prolonged activity.  She is modified her footwear as well using better supportive and cushioned shoes.  She denies any radiating pain weakness or numbness.  She denies any recent injury.  No fevers chills nausea vomiting or diarrhea.  Additionally she notes that she is been having some pain and swelling in the lateral forefoot.  She points to the midshaft of the fourth metatarsal is an area of worsening pain.  She notes this is been worsening over the past few weeks.  She thinks she is altered her gait and putting more pressure in her forefoot because of her heel pain.  She was seen in urgent care on August 24 where she had an x-ray of her foot which showed calcaneal spurs at the insertion of the Achilles tendon and origin of plantar fascia.  X-ray did not show an obvious stress fracture at the metatarsals.   ROS as above:  Exam:  BP (!) 131/51   Pulse 74   Ht 5\' 5"  (1.651 m)   Wt 200 lb (90.7 kg)   BMI 33.28 kg/m  Wt Readings from Last 5 Encounters:  07/03/18 200 lb (90.7 kg)  06/30/18 201 lb (91.2 kg)  02/19/18 197 lb (89.4 kg)  10/02/17 191 lb (86.6 kg)  02/16/17 175 lb (79.4 kg)    Gen: Well NAD HEENT: EOMI,  MMM Lungs: Normal work of breathing. CTABL Heart: RRR no MRG Abd: NABS, Soft. Nondistended, Nontender Exts: Brisk capillary refill, warm and well perfused.  Left foot is largely normal-appearing.   Patient does have mild swelling without erythema at the posterior calcaneus at the insertion of the Achilles tendon.  This is mildly tender to palpation. Slight swelling at the dorsal lateral forefoot. Tender to palpation posterior calcaneus at the insertion of the plantar fascia. Minimally along the midshaft of the dorsal fourth metatarsal. Foot and ankle motion are normal.  Foot and ankle strength are intact.  Pulses capillary fill and sensation are intact distally. Antalgic gait present.  Lab and Radiology Results No results found for this or any previous visit (from the past 72 hour(s)). Dg Foot Complete Left  Result Date: 06/30/2018 CLINICAL DATA:  Left heel pain x3 months EXAM: LEFT FOOT - COMPLETE 3+ VIEW COMPARISON:  None. FINDINGS: No fracture or dislocation is seen. The joint spaces are preserved. The visualized soft tissues are unremarkable. Small plantar and posterior calcaneal enthesopathy. IMPRESSION: No acute osseus abnormality is seen. Small plantar and posterior calcaneal enthesophytes. Electronically Signed   By: Julian Hy M.D.   On: 06/30/2018 16:40   I personally (independently) visualized and performed the interpretation of the images attached in this note.    Assessment and Plan: 47 y.o. female with   Left heel pain.  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 Plan to continue night splint.  We will additionally add eccentric body weight exercises for the Achilles tendon and  plantar fascia. Additionally will work on [employer] using a large frozen block of ice.  Recommend cushioned heel or insole inserts. Additionally will add nitroglycerin patch protocol.  As for the forefoot pain I think she is at risk for developing stress fracture or possibly early stress reaction.  I doubtful for full-thickness stress fracture at this point.  Plan for better control of the heel and modification of her footwear.  If she still sore and painful we  will switch to a postop shoe.  Recheck in 6 weeks.  Return sooner if needed.   No orders of the defined types were placed in this encounter.  Meds ordered this encounter  Medications  . nitroGLYCERIN (NITRODUR - DOSED IN MG/24 HR) 0.2 mg/hr patch    Sig: 1/4 patch to heel daily for tendonitis    Dispense:  30 patch    Refill:  1     Historical information moved to improve visibility of documentation.  Past Medical History:  Diagnosis Date  . Advanced maternal age (AMA), 40 years or greater 07/09/2014  . Anxiety    no meds currently  . Depression    no meds currently  . GERD (gastroesophageal reflux disease)   . Headache(784.0)    migraine  . HTN (hypertension)   . MVP (mitral valve prolapse)    no problems, no prior treatment for dental/surgical per pt  . MVP (mitral valve prolapse)   . Plantar fasciitis   . S/P tubal ligation 07/10/2014  . SVD (spontaneous vaginal delivery)    x 2  . SVD (spontaneous vaginal delivery) 07/10/2014   Past Surgical History:  Procedure Laterality Date  . COLONOSCOPY    . DILATION AND CURETTAGE OF UTERUS     mab x 1  . FINGER SURGERY     cyst removed  . TUBAL LIGATION Bilateral 07/10/2014   Procedure: Post Partum Tubal Ligation, Bilateral Salpingectomy;  Surgeon: Janyth Contes, MD;  Location: [facility];  Service: Gynecology;  Laterality: Bilateral;  . WISDOM TOOTH EXTRACTION     Social History   Tobacco Use  . Smoking status: Former Smoker    Packs/day: 1.00    Years: 7.00    Pack years: 7.00    Types: Cigarettes    Last attempt to quit: 11/07/1998    Years since quitting: 19.6  . Smokeless tobacco: Never Used  Substance Use Topics  . Alcohol use: No    Alcohol/week: 0.0 standard drinks    Comment: daily   family history includes Hyperlipidemia in her unknown relative; Hypertension in her father.  Medications: Current Outpatient Medications  Medication Sig Dispense Refill  . amphetamine-dextroamphetamine (ADDERALL) 10 MG  tablet Take 1 tablet (10 mg total) by mouth 2 (two) times daily. 60 tablet 0  . buPROPion (WELLBUTRIN XL) 300 MG 24 hr tablet Take 1 tablet (300 mg total) by mouth daily. 90 tablet 1  . doxylamine, Sleep, (UNISOM) 25 MG tablet Take 25 mg by mouth at bedtime as needed.    . hydrochlorothiazide (HYDRODIURIL) 25 MG tablet Take 1 tablet (25 mg total) by mouth daily. 90 tablet 3  . pantoprazole (PROTONIX) 40 MG tablet Take 1 tablet (40 mg total) by mouth daily. 90 tablet 3  . sertraline (ZOLOFT) 100 MG tablet Take 1 tablet (100 mg total) by mouth daily. 90 tablet 3  . nitroGLYCERIN (NITRODUR - DOSED IN MG/24 HR) 0.2 mg/hr patch 1/4 patch to heel daily for tendonitis 30 patch 1   No current facility-administered medications for  this visit.    No Known Allergies   Discussed warning signs or symptoms. Please see discharge instructions. Patient expresses understanding.

## 2018-07-03 NOTE — Patient Instructions (Addendum)
Thank you for coming in today. Do the ice massage.  Do the heel up to down slowly exercises. Try to get 30 reps 2-3x daily.  Use gel heel insoles.   Use nitropatches.   Nitroglycerin Protocol   Apply 1/4 nitroglycerin patch to affected area daily.  Change position of patch within the affected area every 24 hours.  You may experience a headache during the first 1-2 weeks of using the patch, these should subside.  If you experience headaches after beginning nitroglycerin patch treatment, you may take your preferred over the counter pain reliever.  Another side effect of the nitroglycerin patch is skin irritation or rash related to patch adhesive.  Please notify our office if you develop more severe headaches or rash, and stop the patch.  Tendon healing with nitroglycerin patch may require 12 to 24 weeks depending on the extent of injury.  Men should not use if taking Viagra, Cialis, or Levitra.   Do not use if you have migraines or rosacea.   Recheck in 6 weeks.  Return sooner if needed.

## 2018-07-06 HISTORY — PX: COLONOSCOPY: SHX174

## 2018-07-06 LAB — HM COLONOSCOPY

## 2018-07-17 ENCOUNTER — Encounter: Payer: Self-pay | Admitting: Family Medicine

## 2018-07-17 ENCOUNTER — Telehealth: Payer: Self-pay | Admitting: Family Medicine

## 2018-07-17 DIAGNOSIS — K648 Other hemorrhoids: Secondary | ICD-10-CM

## 2018-07-17 HISTORY — DX: Other hemorrhoids: K64.8

## 2018-07-17 NOTE — Telephone Encounter (Signed)
Received results from colonoscopy from [facility] endoscopy.  Patient had small polyps pathology was reassuring.  Plan for 10-year recheck. Results will be abstracted.

## 2018-07-26 ENCOUNTER — Encounter: Payer: Self-pay | Admitting: Family Medicine

## 2018-07-26 NOTE — Progress Notes (Signed)
Documentation received from Gregory.  Patient being evaluated for abnormal uterine bleeding.  Had endometrial biopsy in preparation for upcoming hysterectomy scheduled September 27, 2018.  Additionally has urge incontinence and was prescribed oxybutynin 5 mg extended release 1 daily.

## 2018-09-01 IMAGING — DX DG FOOT COMPLETE 3+V*L*
3 series · 3 of 3 positions shown · non-contrast
Comparison: None.

CLINICAL DATA: Left heel pain x3 months

EXAM:
LEFT FOOT - COMPLETE 3+ VIEW

[foot ap]
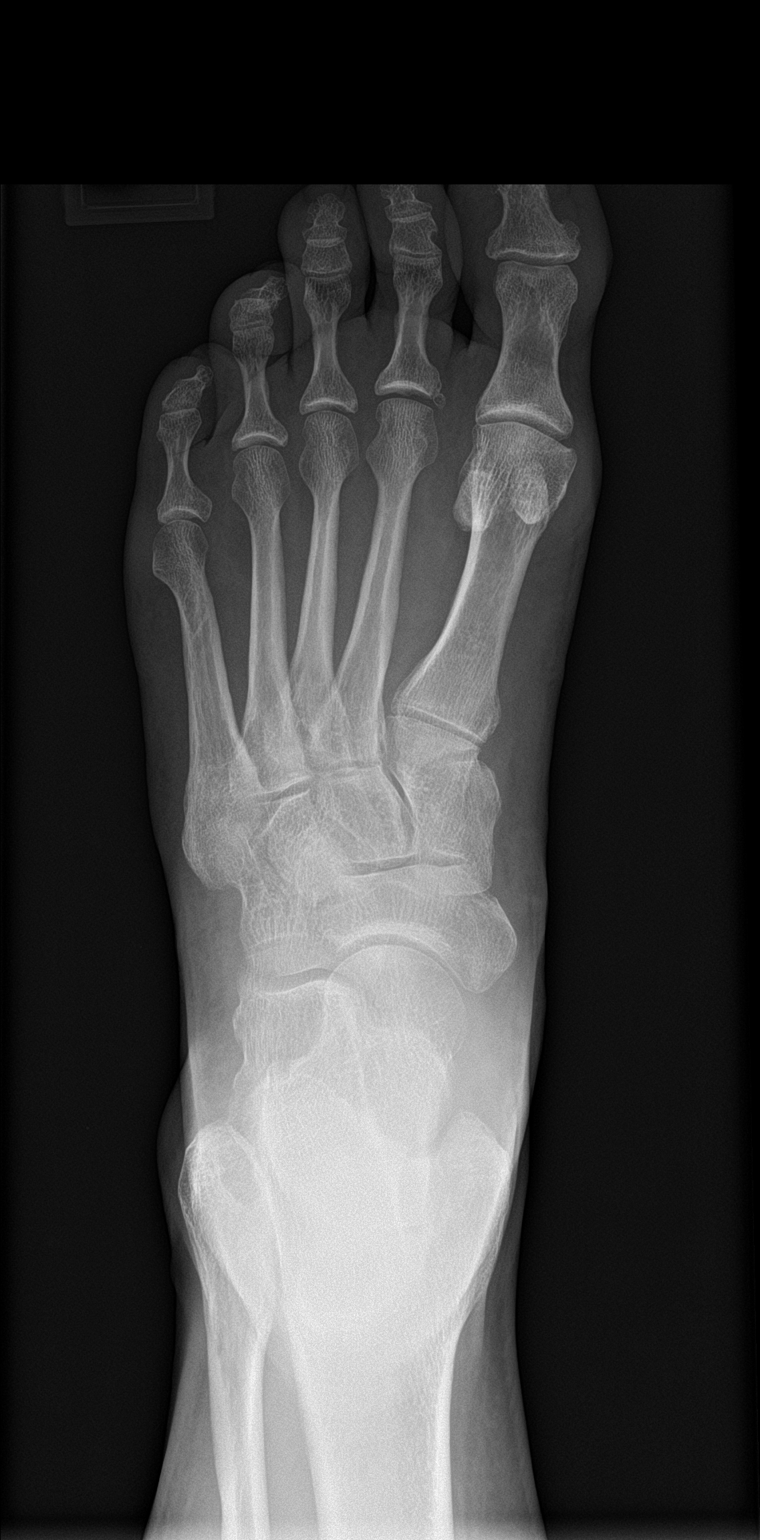

[foot obl]
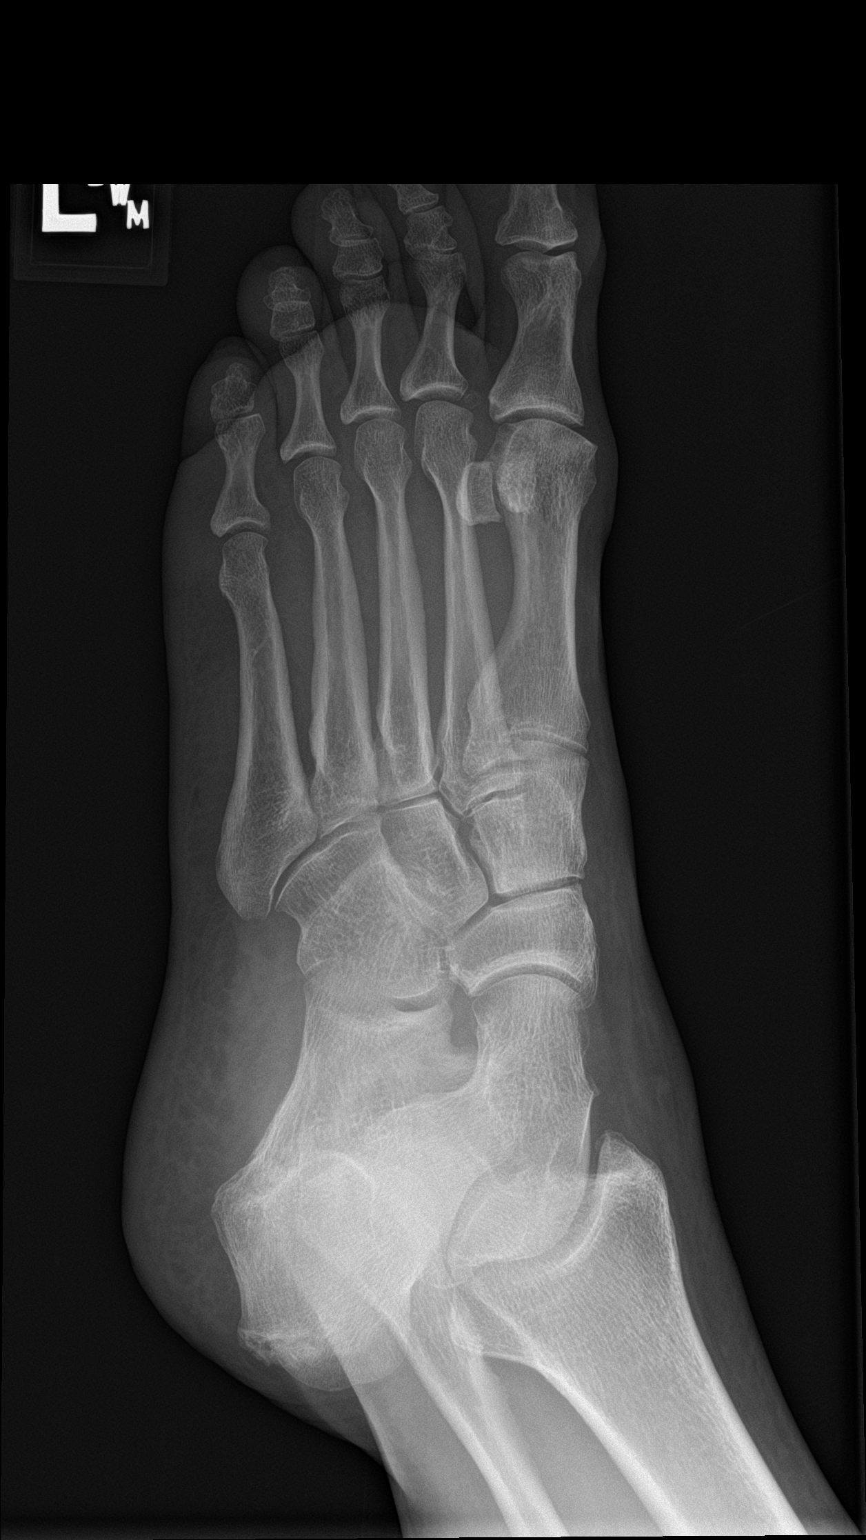

[foot lat]
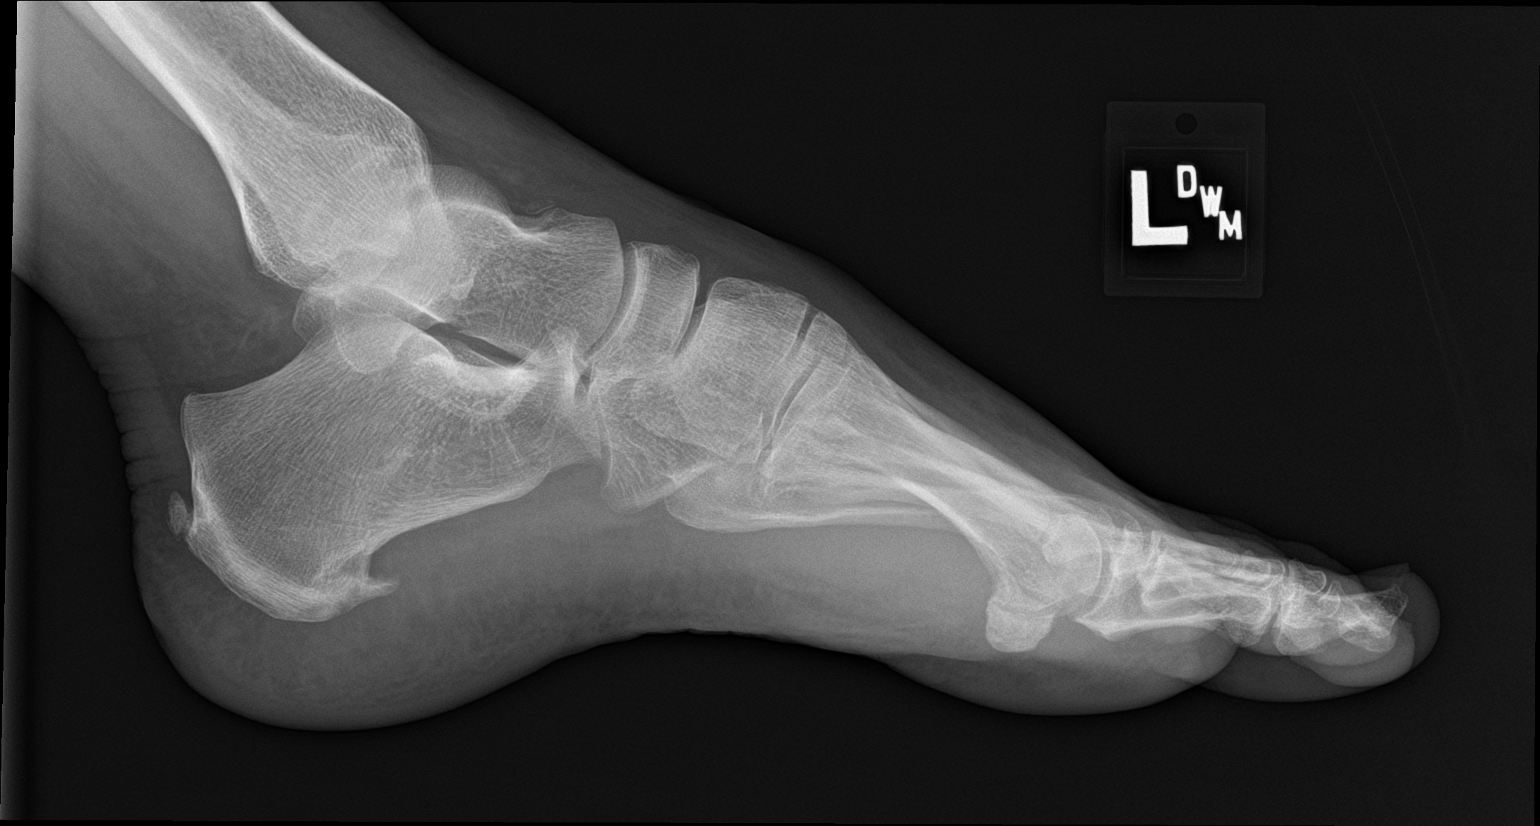

[3 of 3 positions shown; findings below may reference images not displayed]

FINDINGS: No fracture or dislocation is seen.

The joint spaces are preserved.

The visualized soft tissues are unremarkable.

Small plantar and posterior calcaneal enthesopathy.
IMPRESSION: No acute osseus abnormality is seen.

Small plantar and posterior calcaneal enthesophytes.

## 2018-09-18 ENCOUNTER — Telehealth: Payer: Self-pay | Admitting: Family Medicine

## 2018-09-18 NOTE — Telephone Encounter (Signed)
Received documentation from OB/GYN.  Patient being evaluated for abnormal uterine bleeding with thyroid work-up.

## 2018-09-19 NOTE — Patient Instructions (Signed)
Anita Alexander  10-11-1971     Your procedure is scheduled on: 09-27-2018   Report to [facility] Main  Entrance, Report to admitting at  5:30 AM      Call this number if you have problems the morning of surgery [phone]        Remember: Do not eat food or drink liquids :After Midnight.                                        BRUSH YOUR TEETH MORNING OF SURGERY AND RINSE YOUR MOUTH OUT, NO CHEWING GUM CANDY OR MINTS.         Take these medicines the morning of surgery with A SIP OF WATER:  Bupropion (wellbutrin), Sertaline (zoloft), Pantoprazole (protonix)                                   You may not have any metal on your body including hair pins and piercings              Do not wear jewelry, make-up, lotions, powders or perfumes, deodorant              Do not wear nail polish.  Do not shave  48 hours prior to surgery.          Do not bring valuables to the hospital. [place].  Contacts, dentures or bridgework may not be worn into surgery.  Leave suitcase in the car. After surgery it may be brought to your room.      Special Instructions:   HANDOUT FOR [facility] GUIDELINES GIVEN                                                   PLEASE BRING YOUR HOME MEDICATION IN PRESCRIPTION BOTTLES WITH YOU DAY OF SURGERY                 _____________________________________________________________________             [facility] - Preparing for Surgery Before surgery, you can play an important role.  Because skin is not sterile, your skin needs to be as free of germs as possible.  You can reduce the number of germs on your skin by washing with CHG (chlorahexidine gluconate) soap before surgery.  CHG is an antiseptic cleaner which kills germs and bonds with the skin to continue killing germs even after washing. Please DO NOT use if you have an allergy to CHG or  antibacterial soaps.  If your skin becomes reddened/irritated stop using the CHG and inform your nurse when you arrive at Short Stay. Do not shave (including legs and underarms) for at least 48 hours prior to the first CHG shower.  You may shave your face/neck. Please follow these instructions carefully:  1.  Shower with CHG Soap the night before surgery and the  morning of Surgery.  2.  If you choose to wash your hair, wash your hair  first as usual with your  normal  shampoo.  3.  After you shampoo, rinse your hair and body thoroughly to remove the  shampoo.                            4.  Use CHG as you would any other liquid soap.  You can apply chg directly  to the skin and wash                       Gently with a scrungie or clean washcloth.  5.  Apply the CHG Soap to your body ONLY FROM THE NECK DOWN.   Do not use on face/ open                           Wound or open sores. Avoid contact with eyes, ears mouth and genitals (private parts).                       Wash face,  Genitals (private parts) with your normal soap.             6.  Wash thoroughly, paying special attention to the area where your surgery  will be performed.  7.  Thoroughly rinse your body with warm water from the neck down.  8.  DO NOT shower/wash with your normal soap after using and rinsing off  the CHG Soap.             9.  Pat yourself dry with a clean towel.            10.  Wear clean pajamas.            11.  Place clean sheets on your bed the night of your first shower and do not  sleep with pets. Day of Surgery : Do not apply any lotions/deodorants the morning of surgery.  Please wear clean clothes to the hospital/surgery center.  FAILURE TO FOLLOW THESE INSTRUCTIONS MAY RESULT IN THE CANCELLATION OF YOUR SURGERY PATIENT SIGNATURE_________________________________  NURSE SIGNATURE__________________________________  ________________________________________________________________________   [name]  An incentive spirometer is a tool that can help keep your lungs clear and active. This tool measures how well you are filling your lungs with each breath. Taking long deep breaths may help reverse or decrease the chance of developing breathing (pulmonary) problems (especially infection) following:  A long period of time when you are unable to move or be active. BEFORE THE PROCEDURE   If the spirometer includes an indicator to show your best effort, your nurse or respiratory therapist will set it to a desired goal.  If possible, sit up straight or lean slightly forward. Try not to slouch.  Hold the incentive spirometer in an upright position. INSTRUCTIONS FOR USE  1. Sit on the edge of your bed if possible, or sit up as far as you can in bed or on a chair. 2. Hold the incentive spirometer in an upright position. 3. Breathe out normally. 4. Place the mouthpiece in your mouth and seal your lips tightly around it. 5. Breathe in slowly and as deeply as possible, raising the piston or the ball toward the top of the column. 6. Hold your breath for 3-5 seconds or for as long as possible. Allow the piston or ball to fall to the bottom of the column. 7. Remove the mouthpiece from  your mouth and breathe out normally. 8. Rest for a few seconds and repeat Steps 1 through 7 at least 10 times every 1-2 hours when you are awake. Take your time and take a few normal breaths between deep breaths. 9. The spirometer may include an indicator to show your best effort. Use the indicator as a goal to work toward during each repetition. 10. After each set of 10 deep breaths, practice coughing to be sure your lungs are clear. If you have an incision (the cut made at the time of surgery), support your incision when coughing by placing a pillow or rolled up towels firmly against it. Once you are able to get out of bed, walk around indoors and cough well. You may stop using the incentive spirometer when  instructed by your caregiver.  RISKS AND COMPLICATIONS  Take your time so you do not get dizzy or light-headed.  If you are in pain, you may need to take or ask for pain medication before doing incentive spirometry. It is harder to take a deep breath if you are having pain. AFTER USE  Rest and breathe slowly and easily.  It can be helpful to keep track of a log of your progress. Your caregiver can provide you with a simple table to help with this. If you are using the spirometer at home, follow these instructions: [place] IF:   You are having difficultly using the spirometer.  You have trouble using the spirometer as often as instructed.  Your pain medication is not giving enough relief while using the spirometer.  You develop fever of 100.5 F (38.1 C) or higher. SEEK IMMEDIATE MEDICAL CARE IF:   You cough up bloody sputum that had not been present before.  You develop fever of 102 F (38.9 C) or greater.  You develop worsening pain at or near the incision site. MAKE SURE YOU:   Understand these instructions.  Will watch your condition.  Will get help right away if you are not doing well or get worse. Document Released: 03/06/2007 Document Revised: 01/16/2012 Document Reviewed: 05/07/2007 Exit[employer] Patient Information 2014 ExitCare, [place].   ________________________________________________________________________  WHAT IS A BLOOD TRANSFUSION? Blood Transfusion Information  A transfusion is the replacement of blood or some of its parts. Blood is made up of multiple cells which provide different functions.  Red blood cells carry oxygen and are used for blood loss replacement.  White blood cells fight against infection.  Platelets control bleeding.  Plasma helps clot blood.  Other blood products are available for specialized needs, such as hemophilia or other clotting disorders. BEFORE THE TRANSFUSION  Who gives blood for transfusions?   Healthy  volunteers who are fully evaluated to make sure their blood is safe. This is blood bank blood. Transfusion therapy is the safest it has ever been in the practice of medicine. Before blood is taken from a donor, a complete history is taken to make sure that person has no history of diseases nor engages in risky social behavior (examples are intravenous drug use or sexual activity with multiple partners). The donor's travel history is screened to minimize risk of transmitting infections, such as malaria. The donated blood is tested for signs of infectious diseases, such as HIV and hepatitis. The blood is then tested to be sure it is compatible with you in order to minimize the chance of a transfusion reaction. If you or a relative donates blood, this is often done in anticipation of surgery and is  not appropriate for emergency situations. It takes many days to process the donated blood. RISKS AND COMPLICATIONS Although transfusion therapy is very safe and saves many lives, the main dangers of transfusion include:   Getting an infectious disease.  Developing a transfusion reaction. This is an allergic reaction to something in the blood you were given. Every precaution is taken to prevent this. The decision to have a blood transfusion has been considered carefully by your caregiver before blood is given. Blood is not given unless the benefits outweigh the risks. AFTER THE TRANSFUSION  Right after receiving a blood transfusion, you will usually feel much better and more energetic. This is especially true if your red blood cells have gotten low (anemic). The transfusion raises the level of the red blood cells which carry oxygen, and this usually causes an energy increase.  The nurse administering the transfusion will monitor you carefully for complications. HOME CARE INSTRUCTIONS  No special instructions are needed after a transfusion. You may find your energy is better. Speak with your caregiver about any  limitations on activity for underlying diseases you may have. SEEK MEDICAL CARE IF:   Your condition is not improving after your transfusion.  You develop redness or irritation at the intravenous (IV) site. SEEK IMMEDIATE MEDICAL CARE IF:  Any of the following symptoms occur over the next 12 hours:  Shaking chills.  You have a temperature by mouth above 102 F (38.9 C), not controlled by medicine.  Chest, back, or muscle pain.  People around you feel you are not acting correctly or are confused.  Shortness of breath or difficulty breathing.  Dizziness and fainting.  You get a rash or develop hives.  You have a decrease in urine output.  Your urine turns a dark color or changes to pink, red, or brown. Any of the following symptoms occur over the next 10 days:  You have a temperature by mouth above 102 F (38.9 C), not controlled by medicine.  Shortness of breath.  Weakness after normal activity.  The white part of the eye turns yellow (jaundice).  You have a decrease in the amount of urine or are urinating less often.  Your urine turns a dark color or changes to pink, red, or brown. Document Released: 10/21/2000 Document Revised: 01/16/2012 Document Reviewed: 06/09/2008 [facility] Patient Information 2014 [place], [place].  _______________________________________________________________________

## 2018-09-20 ENCOUNTER — Encounter (HOSPITAL_COMMUNITY)
Admission: RE | Admit: 2018-09-20 | Discharge: 2018-09-20 | Disposition: A | Discharge status: Home / Self Care | Payer: 59 | Source: Ambulatory Visit | Attending: Obstetrics and Gynecology | Admitting: Obstetrics and Gynecology

## 2018-09-20 ENCOUNTER — Encounter (HOSPITAL_COMMUNITY): Payer: Self-pay

## 2018-09-20 ENCOUNTER — Other Ambulatory Visit: Payer: Self-pay

## 2018-09-20 DIAGNOSIS — Z01818 Encounter for other preprocedural examination: Secondary | ICD-10-CM | POA: Diagnosis present

## 2018-09-20 HISTORY — DX: Other specified abnormal immunological findings in serum: R76.8

## 2018-09-20 HISTORY — DX: Achilles tendinitis, left leg: M76.62

## 2018-09-20 HISTORY — DX: Major depressive disorder, single episode, unspecified: F32.9

## 2018-09-20 HISTORY — DX: Anemia, unspecified: D64.9

## 2018-09-20 HISTORY — DX: Generalized anxiety disorder: F41.1

## 2018-09-20 HISTORY — DX: Presence of spectacles and contact lenses: Z97.3

## 2018-09-20 HISTORY — DX: Attention-deficit hyperactivity disorder, unspecified type: F90.9

## 2018-09-20 LAB — CBC
HCT: 39.8 % (ref 36.0–46.0)
HEMOGLOBIN: 13.2 g/dL (ref 12.0–15.0)
MCH: 30.1 pg (ref 26.0–34.0)
MCHC: 33.2 g/dL (ref 30.0–36.0)
MCV: 90.7 fL (ref 80.0–100.0)
NRBC: 0 % (ref 0.0–0.2)
Platelets: 266 10*3/uL (ref 150–400)
RBC: 4.39 MIL/uL (ref 3.87–5.11)
RDW: 13.1 % (ref 11.5–15.5)
WBC: 6.7 10*3/uL (ref 4.0–10.5)

## 2018-09-20 LAB — COMPREHENSIVE METABOLIC PANEL
ALK PHOS: 64 U/L (ref 38–126)
ALT: 26 U/L (ref 0–44)
AST: 23 U/L (ref 15–41)
Albumin: 4.4 g/dL (ref 3.5–5.0)
Anion gap: 10 (ref 5–15)
BILIRUBIN TOTAL: 0.8 mg/dL (ref 0.3–1.2)
BUN: 14 mg/dL (ref 6–20)
CALCIUM: 9 mg/dL (ref 8.9–10.3)
CHLORIDE: 102 mmol/L (ref 98–111)
CO2: 25 mmol/L (ref 22–32)
CREATININE: 0.83 mg/dL (ref 0.44–1.00)
Glucose, Bld: 110 mg/dL — ABNORMAL HIGH (ref 70–99)
Potassium: 3.5 mmol/L (ref 3.5–5.1)
Sodium: 137 mmol/L (ref 135–145)
TOTAL PROTEIN: 7.3 g/dL (ref 6.5–8.1)

## 2018-09-20 LAB — ABO/RH: ABO/RH(D): A POS

## 2018-09-20 NOTE — Progress Notes (Signed)
Final EKG dated 09-20-2018 in epic.

## 2018-09-26 ENCOUNTER — Encounter (HOSPITAL_COMMUNITY): Payer: Self-pay | Admitting: *Deleted

## 2018-09-26 NOTE — H&P (Signed)
Anita Alexander is an 47 y.o. female [phone] with AUB, menorrhagia for definitive management.  D/W pt r/b/a of LAVH.    Pt offered various options for control of AUB - Pt desires defnitive treatment.    Pertinent Gynecological History:  OB History: G7 P3043 SVD x 3 - 7#24-10#4 SAB x 4  No abn pap - last 10/17, HR HPV neg No STD   Menstrual History: Patient's last menstrual period was 08/27/2018 (approximate).    Past Medical History:  Diagnosis Date  . Achilles tendinitis, left leg   . ADHD   . Anemia   . GAD (generalized anxiety disorder)   . GERD (gastroesophageal reflux disease)   . Headache(784.0)    migraine  . 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 HTN (hypertension)   . Internal hemorrhoid 07/17/2018   Found on colonoscopy August 2019  . MDD (major depressive disorder)   . Plantar fasciitis    left  . Wears contact lenses   h/o MVP  Past Surgical History:  Procedure Laterality Date  . COLONOSCOPY  07/06/2018  . DILATION AND CURETTAGE OF UTERUS  03-05-2009   dr Willis Modena  @[facility]    w/ suction for missed ab  . FINGER SURGERY Right 1993   cyst removed  . OVUM / OOCYTE RETRIEVAL  x2  last one 04-11-2013  . TRANSTHORACIC ECHOCARDIOGRAM  07/27/2016   ef 55-60%/  trivial MR (no evidence mvp)  . TUBAL LIGATION Bilateral 07/10/2014   Procedure: Post Partum Tubal Ligation, Bilateral Salpingectomy;  Surgeon: Janyth Contes, MD;  Location: [facility];  Service: Gynecology;  Laterality: Bilateral;  . WISDOM TOOTH EXTRACTION      Family History  Problem Relation Age of Onset  . Hypertension Father   . Hyperlipidemia Unknown    CAD,depression, anxiety, lung CA, brain CA  Social History:  reports that she quit smoking about 19 years ago. Her smoking use included cigarettes. She has a 7.00 pack-year smoking history. She has never used smokeless tobacco. She reports that she drinks about 14.0 standard drinks of alcohol per week. She reports that she does not use  drugs. married SAHM  Allergies: No Known Allergies  Meds: buproprion 300mg , HCTZ 25mg , sertraline 100mg     Review of Systems  Constitutional: Negative.   HENT: Negative.   Eyes: Negative.   Respiratory: Negative.   Cardiovascular: Negative.   Gastrointestinal: Negative.   Genitourinary: Positive for urgency.  Musculoskeletal: Negative.   Skin: Negative.   Neurological: Negative.   Endo/Heme/Allergies: Negative.   Psychiatric/Behavioral: Negative.     Last menstrual period 08/27/2018. Physical Exam  Constitutional: She is oriented to person, place, and time. She appears well-developed and well-nourished.  HENT:  Head: Normocephalic and atraumatic.  Cardiovascular: Normal rate and regular rhythm.  Respiratory: Effort normal and breath sounds normal. No respiratory distress. She has no wheezes.  GI: Soft. Bowel sounds are normal. She exhibits no distension. There is no tenderness.  Musculoskeletal: Normal range of motion.  Neurological: She is alert and oriented to person, place, and time.  Skin: Skin is warm and dry.  Psychiatric: She has a normal mood and affect. Her behavior is normal.   Nl TSH, ur cx neg  EMB benign  Assessment/Plan: 35KK [id number] with AUB/dysmenorrhea for LAVH D/w pt r/b/a of procedure, process and expectations Ancef for prophylaxis    Nini Cavan Bovard-Stuckert 09/26/2018, 9:14 PM

## 2018-09-26 NOTE — Anesthesia Preprocedure Evaluation (Addendum)
Anesthesia Evaluation  Patient identified by MRN, date of birth, ID band Patient awake    Reviewed: Allergy & Precautions, NPO status , Patient's Chart, lab work & pertinent test results  History of Anesthesia Complications Negative for: history of anesthetic complications  Airway Mallampati: II  TM Distance: >3 FB Neck ROM: Full    Dental  (+) Dental Advisory Given, Chipped,    Pulmonary former smoker,    breath sounds clear to auscultation       Cardiovascular hypertension, Pt. on medications  Rhythm:Regular Rate:Normal     Neuro/Psych  Headaches, PSYCHIATRIC DISORDERS Anxiety Depression    GI/Hepatic Neg liver ROS, GERD  Medicated and Controlled,  Endo/Other  negative endocrine ROS Obesity   Renal/GU negative Renal ROS     Musculoskeletal negative musculoskeletal ROS (+)   Abdominal   Peds  (+) ADHD Hematology negative hematology ROS (+)   Anesthesia Other Findings   Reproductive/Obstetrics  S/p tubal ligation                            Anesthesia Physical Anesthesia Plan  ASA: II  Anesthesia Plan: General   Post-op Pain Management:    Induction: Intravenous  PONV Risk Score and Plan: 3 and Treatment may vary due to age or medical condition, Ondansetron, Dexamethasone and Midazolam  Airway Management Planned: Oral ETT  Additional Equipment: None  Intra-op Plan:   Post-operative Plan: Extubation in OR  Informed Consent: I have reviewed the patients History and Physical, chart, labs and discussed the procedure including the risks, benefits and alternatives for the proposed anesthesia with the patient or authorized representative who has indicated his/her understanding and acceptance.   Dental advisory given  Plan Discussed with: CRNA and Anesthesiologist  Anesthesia Plan Comments:        Anesthesia Quick Evaluation

## 2018-09-27 ENCOUNTER — Observation Stay (HOSPITAL_BASED_OUTPATIENT_CLINIC_OR_DEPARTMENT_OTHER): Payer: 59 | Admitting: Anesthesiology

## 2018-09-27 ENCOUNTER — Encounter (HOSPITAL_COMMUNITY): Payer: Self-pay | Admitting: *Deleted

## 2018-09-27 ENCOUNTER — Observation Stay (HOSPITAL_BASED_OUTPATIENT_CLINIC_OR_DEPARTMENT_OTHER)
Admission: RE | Admit: 2018-09-27 | Discharge: 2018-09-27 | Disposition: A | Discharge status: Home / Self Care | Payer: 59 | Source: Ambulatory Visit | Attending: Obstetrics and Gynecology | Admitting: Obstetrics and Gynecology

## 2018-09-27 ENCOUNTER — Encounter (HOSPITAL_COMMUNITY)
Admission: RE | Disposition: A | Discharge status: Home / Self Care | Payer: Self-pay | Source: Ambulatory Visit | Attending: Obstetrics and Gynecology

## 2018-09-27 DIAGNOSIS — K219 Gastro-esophageal reflux disease without esophagitis: Secondary | ICD-10-CM | POA: Diagnosis not present

## 2018-09-27 DIAGNOSIS — Z87891 Personal history of nicotine dependence: Secondary | ICD-10-CM | POA: Diagnosis not present

## 2018-09-27 DIAGNOSIS — D259 Leiomyoma of uterus, unspecified: Secondary | ICD-10-CM | POA: Insufficient documentation

## 2018-09-27 DIAGNOSIS — Z90710 Acquired absence of both cervix and uterus: Secondary | ICD-10-CM | POA: Diagnosis present

## 2018-09-27 DIAGNOSIS — F418 Other specified anxiety disorders: Secondary | ICD-10-CM | POA: Insufficient documentation

## 2018-09-27 DIAGNOSIS — I10 Essential (primary) hypertension: Secondary | ICD-10-CM | POA: Insufficient documentation

## 2018-09-27 DIAGNOSIS — N920 Excessive and frequent menstruation with regular cycle: Secondary | ICD-10-CM | POA: Diagnosis present

## 2018-09-27 DIAGNOSIS — Z79899 Other long term (current) drug therapy: Secondary | ICD-10-CM | POA: Insufficient documentation

## 2018-09-27 HISTORY — PX: LAPAROSCOPIC VAGINAL HYSTERECTOMY WITH SALPINGECTOMY: SHX6680

## 2018-09-27 LAB — TYPE AND SCREEN
ABO/RH(D): A POS
ANTIBODY SCREEN: NEGATIVE

## 2018-09-27 LAB — PREGNANCY, URINE: Preg Test, Ur: NEGATIVE

## 2018-09-27 SURGERY — HYSTERECTOMY, VAGINAL, LAPAROSCOPY-ASSISTED, WITH SALPINGECTOMY
Anesthesia: General | Site: Abdomen | Laterality: Bilateral

## 2018-09-27 MED ORDER — SUGAMMADEX SODIUM 200 MG/2ML IV SOLN
INTRAVENOUS | Status: DC | PRN
  Administered 2018-09-27: 200 mg via INTRAVENOUS

## 2018-09-27 MED ORDER — BUPROPION HCL ER (XL) 300 MG PO TB24: 300.0000 mg | ORAL_TABLET | Freq: Every morning | ORAL | Status: DC

## 2018-09-27 MED ORDER — SERTRALINE HCL 100 MG PO TABS: 100.0000 mg | ORAL_TABLET | Freq: Every morning | ORAL | Status: DC

## 2018-09-27 MED ORDER — SODIUM CHLORIDE (PF) 0.9 % IJ SOLN
INTRAMUSCULAR | Status: AC
  Filled 2018-09-27: qty 100

## 2018-09-27 MED ORDER — FENTANYL CITRATE (PF) 100 MCG/2ML IJ SOLN
25.0000 ug | INTRAMUSCULAR | Status: DC | PRN
  Administered 2018-09-27 (×2): 50 ug via INTRAVENOUS

## 2018-09-27 MED ORDER — SIMETHICONE 80 MG PO CHEW: 80.0000 mg | CHEWABLE_TABLET | Freq: Four times a day (QID) | ORAL | Status: DC | PRN

## 2018-09-27 MED ORDER — MIDAZOLAM HCL 2 MG/2ML IJ SOLN
INTRAMUSCULAR | Status: AC
  Filled 2018-09-27: qty 2

## 2018-09-27 MED ORDER — FENTANYL CITRATE (PF) 100 MCG/2ML IJ SOLN
INTRAMUSCULAR | Status: AC
  Filled 2018-09-27: qty 2

## 2018-09-27 MED ORDER — SCOPOLAMINE 1 MG/3DAYS TD PT72
MEDICATED_PATCH | TRANSDERMAL | Status: AC
  Filled 2018-09-27: qty 1

## 2018-09-27 MED ORDER — VASOPRESSIN 20 UNIT/ML IV SOLN
0.0300 [IU]/min | INTRAVENOUS | Status: DC
  Filled 2018-09-27: qty 2

## 2018-09-27 MED ORDER — FENTANYL CITRATE (PF) 100 MCG/2ML IJ SOLN
INTRAMUSCULAR | Status: AC
  Filled 2018-09-27: qty 4

## 2018-09-27 MED ORDER — ONDANSETRON HCL 4 MG/2ML IJ SOLN
INTRAMUSCULAR | Status: AC
  Filled 2018-09-27: qty 2

## 2018-09-27 MED ORDER — ONDANSETRON HCL 4 MG/2ML IJ SOLN
INTRAMUSCULAR | Status: DC | PRN
  Administered 2018-09-27: 4 mg via INTRAVENOUS

## 2018-09-27 MED ORDER — KETOROLAC TROMETHAMINE 30 MG/ML IJ SOLN
INTRAMUSCULAR | Status: DC | PRN
  Administered 2018-09-27: 30 mg via INTRAVENOUS

## 2018-09-27 MED ORDER — LIDOCAINE 2% (20 MG/ML) 5 ML SYRINGE
INTRAMUSCULAR | Status: DC | PRN
  Administered 2018-09-27: 80 mg via INTRAVENOUS

## 2018-09-27 MED ORDER — PROMETHAZINE HCL 25 MG/ML IJ SOLN: 6.2500 mg | INTRAMUSCULAR | Status: DC | PRN

## 2018-09-27 MED ORDER — BUPIVACAINE HCL (PF) 0.25 % IJ SOLN
INTRAMUSCULAR | Status: AC
  Filled 2018-09-27: qty 30

## 2018-09-27 MED ORDER — MENTHOL 3 MG MT LOZG: 1.0000 | LOZENGE | OROMUCOSAL | Status: DC | PRN

## 2018-09-27 MED ORDER — OXYCODONE-ACETAMINOPHEN 5-325 MG PO TABS: 1.0000 | ORAL_TABLET | ORAL | 0 refills | Status: DC | PRN

## 2018-09-27 MED ORDER — ACETAMINOPHEN 10 MG/ML IV SOLN
INTRAVENOUS | Status: AC
  Filled 2018-09-27: qty 100

## 2018-09-27 MED ORDER — OXYCODONE-ACETAMINOPHEN 5-325 MG PO TABS
1.0000 | ORAL_TABLET | ORAL | Status: DC | PRN
  Administered 2018-09-27 (×2): 1 via ORAL
  Filled 2018-09-27 (×2): qty 1

## 2018-09-27 MED ORDER — BUPIVACAINE HCL (PF) 0.25 % IJ SOLN
INTRAMUSCULAR | Status: DC | PRN
  Administered 2018-09-27: 8 mL

## 2018-09-27 MED ORDER — SUGAMMADEX SODIUM 200 MG/2ML IV SOLN
INTRAVENOUS | Status: AC
  Filled 2018-09-27: qty 2

## 2018-09-27 MED ORDER — SODIUM CHLORIDE FLUSH 0.9 % IV SOLN
INTRAVENOUS | Status: DC | PRN
  Administered 2018-09-27: 100 mL via INTRAVENOUS

## 2018-09-27 MED ORDER — ROCURONIUM BROMIDE 10 MG/ML (PF) SYRINGE
PREFILLED_SYRINGE | INTRAVENOUS | Status: DC | PRN
  Administered 2018-09-27: 10 mg via INTRAVENOUS
  Administered 2018-09-27: 60 mg via INTRAVENOUS

## 2018-09-27 MED ORDER — FLUORESCEIN SODIUM 10 % IV SOLN
INTRAVENOUS | Status: DC | PRN
  Administered 2018-09-27: 2 mL via INTRAVENOUS

## 2018-09-27 MED ORDER — VASOPRESSIN 20 UNIT/ML IV SOLN
INTRAVENOUS | Status: DC | PRN
  Administered 2018-09-27: 20 [IU]

## 2018-09-27 MED ORDER — SCOPOLAMINE 1 MG/3DAYS TD PT72
MEDICATED_PATCH | TRANSDERMAL | Status: DC | PRN
  Administered 2018-09-27: 1 via TRANSDERMAL

## 2018-09-27 MED ORDER — PANTOPRAZOLE SODIUM 40 MG PO TBEC: 40.0000 mg | DELAYED_RELEASE_TABLET | Freq: Every morning | ORAL | Status: DC

## 2018-09-27 MED ORDER — HYDROMORPHONE HCL 1 MG/ML IJ SOLN: 0.2000 mg | INTRAMUSCULAR | Status: DC | PRN

## 2018-09-27 MED ORDER — PROMETHAZINE HCL 25 MG/ML IJ SOLN
12.5000 mg | Freq: Once | INTRAMUSCULAR | Status: AC
  Administered 2018-09-27: 12.5 mg via INTRAVENOUS
  Filled 2018-09-27 (×2): qty 1

## 2018-09-27 MED ORDER — LACTATED RINGERS IV SOLN
INTRAVENOUS | Status: DC
  Administered 2018-09-27: 12:00:00 via INTRAVENOUS

## 2018-09-27 MED ORDER — FENTANYL CITRATE (PF) 250 MCG/5ML IJ SOLN
INTRAMUSCULAR | Status: AC
  Filled 2018-09-27: qty 5

## 2018-09-27 MED ORDER — GUAIFENESIN 100 MG/5ML PO SOLN: 15.0000 mL | ORAL | Status: DC | PRN

## 2018-09-27 MED ORDER — PROPOFOL 10 MG/ML IV BOLUS
INTRAVENOUS | Status: AC
  Filled 2018-09-27: qty 40

## 2018-09-27 MED ORDER — KETOROLAC TROMETHAMINE 30 MG/ML IJ SOLN
INTRAMUSCULAR | Status: AC
  Filled 2018-09-27: qty 1

## 2018-09-27 MED ORDER — VASOPRESSIN 20 UNIT/ML IV SOLN
INTRAVENOUS | Status: AC
  Filled 2018-09-27: qty 1

## 2018-09-27 MED ORDER — PROMETHAZINE HCL 12.5 MG PO TABS: 12.5000 mg | ORAL_TABLET | Freq: Four times a day (QID) | ORAL | 0 refills | Status: AC | PRN

## 2018-09-27 MED ORDER — ONDANSETRON HCL 4 MG PO TABS: 4.0000 mg | ORAL_TABLET | Freq: Four times a day (QID) | ORAL | Status: DC | PRN

## 2018-09-27 MED ORDER — DEXAMETHASONE SODIUM PHOSPHATE 10 MG/ML IJ SOLN
INTRAMUSCULAR | Status: AC
  Filled 2018-09-27: qty 1

## 2018-09-27 MED ORDER — ARTIFICIAL TEARS OPHTHALMIC OINT
TOPICAL_OINTMENT | OPHTHALMIC | Status: AC
  Filled 2018-09-27: qty 3.5

## 2018-09-27 MED ORDER — LIDOCAINE 2% (20 MG/ML) 5 ML SYRINGE
INTRAMUSCULAR | Status: AC
  Filled 2018-09-27: qty 5

## 2018-09-27 MED ORDER — FLUORESCEIN SODIUM 10 % IV SOLN
INTRAVENOUS | Status: AC
  Filled 2018-09-27: qty 5

## 2018-09-27 MED ORDER — PROPOFOL 10 MG/ML IV BOLUS
INTRAVENOUS | Status: DC | PRN
  Administered 2018-09-27: 50 mg via INTRAVENOUS
  Administered 2018-09-27: 200 mg via INTRAVENOUS

## 2018-09-27 MED ORDER — MIDAZOLAM HCL 2 MG/2ML IJ SOLN
INTRAMUSCULAR | Status: DC | PRN
  Administered 2018-09-27: 2 mg via INTRAVENOUS

## 2018-09-27 MED ORDER — HYDROCHLOROTHIAZIDE 25 MG PO TABS: 25.0000 mg | ORAL_TABLET | Freq: Every morning | ORAL | Status: DC

## 2018-09-27 MED ORDER — CEFAZOLIN SODIUM-DEXTROSE 2-4 GM/100ML-% IV SOLN
2.0000 g | INTRAVENOUS | Status: AC
  Administered 2018-09-27: 2 g via INTRAVENOUS
  Filled 2018-09-27: qty 100

## 2018-09-27 MED ORDER — FENTANYL CITRATE (PF) 100 MCG/2ML IJ SOLN
INTRAMUSCULAR | Status: DC | PRN
  Administered 2018-09-27: 100 ug via INTRAVENOUS
  Administered 2018-09-27 (×5): 50 ug via INTRAVENOUS

## 2018-09-27 MED ORDER — LACTATED RINGERS IV SOLN
INTRAVENOUS | Status: DC
  Administered 2018-09-27 (×2): via INTRAVENOUS

## 2018-09-27 MED ORDER — ALUM & MAG HYDROXIDE-SIMETH 200-200-20 MG/5ML PO SUSP: 30.0000 mL | ORAL | Status: DC | PRN

## 2018-09-27 MED ORDER — SODIUM CHLORIDE 0.9 % IR SOLN
Status: DC | PRN
  Administered 2018-09-27: 1000 mL

## 2018-09-27 MED ORDER — IBUPROFEN 200 MG PO TABS
800.0000 mg | ORAL_TABLET | Freq: Three times a day (TID) | ORAL | Status: DC | PRN
  Administered 2018-09-27: 800 mg via ORAL
  Filled 2018-09-27: qty 4

## 2018-09-27 MED ORDER — OXYCODONE HCL 5 MG PO TABS: 5.0000 mg | ORAL_TABLET | Freq: Once | ORAL | Status: DC | PRN

## 2018-09-27 MED ORDER — DEXAMETHASONE SODIUM PHOSPHATE 10 MG/ML IJ SOLN
INTRAMUSCULAR | Status: DC | PRN
  Administered 2018-09-27: 5 mg via INTRAVENOUS

## 2018-09-27 MED ORDER — IBUPROFEN 800 MG PO TABS: 800.0000 mg | ORAL_TABLET | Freq: Three times a day (TID) | ORAL | 0 refills | Status: DC | PRN

## 2018-09-27 MED ORDER — ONDANSETRON HCL 4 MG/2ML IJ SOLN
4.0000 mg | Freq: Four times a day (QID) | INTRAMUSCULAR | Status: DC | PRN
  Administered 2018-09-27: 4 mg via INTRAVENOUS
  Filled 2018-09-27: qty 2

## 2018-09-27 MED ORDER — ROCURONIUM BROMIDE 100 MG/10ML IV SOLN
INTRAVENOUS | Status: AC
  Filled 2018-09-27: qty 1

## 2018-09-27 MED ORDER — OXYCODONE HCL 5 MG/5ML PO SOLN
5.0000 mg | Freq: Once | ORAL | Status: DC | PRN
  Filled 2018-09-27: qty 5

## 2018-09-27 MED ORDER — ACETAMINOPHEN 10 MG/ML IV SOLN
INTRAVENOUS | Status: DC | PRN
  Administered 2018-09-27: 1000 mg via INTRAVENOUS

## 2018-09-27 SURGICAL SUPPLY — 44 items
ADH SKN CLS APL DERMABOND .7 (GAUZE/BANDAGES/DRESSINGS) ×1
APPLICATOR ARISTA FLEXITIP XL (MISCELLANEOUS) IMPLANT
CABLE HIGH FREQUENCY MONO STRZ (ELECTRODE) IMPLANT
CONT SPECI 4OZ STER CLIK (MISCELLANEOUS) ×3 IMPLANT
COVER BACK TABLE 60X90IN (DRAPES) ×3 IMPLANT
COVER MAYO STAND STRL (DRAPES) ×3 IMPLANT
COVER WAND RF STERILE (DRAPES) IMPLANT
DECANTER SPIKE VIAL GLASS SM (MISCELLANEOUS) IMPLANT
DERMABOND ADVANCED (GAUZE/BANDAGES/DRESSINGS) ×2
DERMABOND ADVANCED .7 DNX12 (GAUZE/BANDAGES/DRESSINGS) ×1 IMPLANT
DRSG COVADERM PLUS 2X2 (GAUZE/BANDAGES/DRESSINGS) IMPLANT
DRSG OPSITE POSTOP 3X4 (GAUZE/BANDAGES/DRESSINGS) IMPLANT
DURAPREP 26ML APPLICATOR (WOUND CARE) ×3 IMPLANT
ELECT REM PT RETURN 9FT ADLT (ELECTROSURGICAL)
ELECTRODE REM PT RTRN 9FT ADLT (ELECTROSURGICAL) IMPLANT
FILTER SMOKE EVAC LAPAROSHD (FILTER) ×3 IMPLANT
GAUZE 4X4 16PLY RFD (DISPOSABLE) ×3 IMPLANT
GLOVE BIO SURGEON STRL SZ 6.5 (GLOVE) ×6 IMPLANT
GLOVE BIO SURGEONS STRL SZ 6.5 (GLOVE) ×3
HEMOSTAT ARISTA ABSORB 3G PWDR (MISCELLANEOUS) IMPLANT
NEEDLE INSUFFLATION 120MM (ENDOMECHANICALS) ×3 IMPLANT
NS IRRIG 1000ML POUR BTL (IV SOLUTION) ×3 IMPLANT
PACK LAVH (CUSTOM PROCEDURE TRAY) ×3 IMPLANT
PACK TRENDGUARD 450 HYBRID PRO (MISCELLANEOUS) IMPLANT
PAD OB MATERNITY 4.3X12.25 (PERSONAL CARE ITEMS) ×3 IMPLANT
PROTECTOR NERVE ULNAR (MISCELLANEOUS) ×6 IMPLANT
SET IRRIG TUBING LAPAROSCOPIC (IRRIGATION / IRRIGATOR) IMPLANT
SET IRRIG Y TYPE TUR BLADDER L (SET/KITS/TRAYS/PACK) IMPLANT
SHEARS HARMONIC 9CM CVD (BLADE) ×3 IMPLANT
SHEARS HARMONIC ACE PLUS 36CM (ENDOMECHANICALS) ×3 IMPLANT
SUT VIC AB 1 CT1 18XBRD ANBCTR (SUTURE) ×2 IMPLANT
SUT VIC AB 1 CT1 8-18 (SUTURE) ×6
SUT VIC AB 2-0 CT1 (SUTURE) ×3 IMPLANT
SUT VIC AB 3-0 SH 27 (SUTURE) ×3
SUT VIC AB 3-0 SH 27X BRD (SUTURE) ×1 IMPLANT
SUT VIC AB 4-0 PS2 27 (SUTURE) ×3 IMPLANT
SUT VICRYL 0 TIES 12 18 (SUTURE) ×3 IMPLANT
SUT VICRYL 0 UR6 27IN ABS (SUTURE) IMPLANT
TOWEL OR 17X24 6PK STRL BLUE (TOWEL DISPOSABLE) ×6 IMPLANT
TRAY FOLEY W/BAG SLVR 14FR (SET/KITS/TRAYS/PACK) ×3 IMPLANT
TRENDGUARD 450 HYBRID PRO PACK (MISCELLANEOUS)
TROCAR BLADELESS OPT 5 100 (ENDOMECHANICALS) ×9 IMPLANT
TUBING INSUF HEATED (TUBING) ×3 IMPLANT
WARMER LAPAROSCOPE (MISCELLANEOUS) ×3 IMPLANT

## 2018-09-27 NOTE — Transfer of Care (Signed)
Immediate Anesthesia Transfer of Care Note  Patient: Anita Alexander  Procedure(s) Performed: LAPAROSCOPIC ASSISTED VAGINAL HYSTERECTOMY (Bilateral Abdomen)  Patient Location: PACU  Anesthesia Type:General  Level of Consciousness: awake, alert , oriented and patient cooperative  Airway & Oxygen Therapy: Patient Spontanous Breathing and Patient connected to face mask oxygen  Post-op Assessment: Report given to RN and Post -op Vital signs reviewed and stable  Post vital signs: Reviewed and stable  Last Vitals:  Vitals Value Taken Time  BP 111/77 09/27/2018 10:16 AM  Temp    Pulse 68 09/27/2018 10:17 AM  Resp    SpO2 100 % 09/27/2018 10:17 AM  Vitals shown include unvalidated device data.  Last Pain:  Vitals:   09/27/18 0602  TempSrc: Oral         Complications: No apparent anesthesia complications

## 2018-09-27 NOTE — Discharge Summary (Signed)
Physician Discharge Summary  Patient ID: Anita Alexander MRN: [id number] DOB/AGE: 1971/04/08 47 y.o.  Admit date: 09/27/2018 Discharge date: 09/27/2018  Admission Diagnoses: AUB, irregular VB  Discharge Diagnoses:  Active Problems:   Status post laparoscopic assisted vaginal hysterectomy (LAVH)   Discharged Condition: good  Hospital Course: admitted for LAVH, underwent surgery without difficulty or complication.  Pt ambulating, voiding, tolerating po.  Desires discharge to home, pain controlled.    Consults: None  Significant Diagnostic Studies: labs: CBC, CMP  Treatments: analgesia: Percocet and ibuprofen and surgery: LAVH, cytoscopy  Discharge Exam: Blood pressure 131/78, pulse 68, temperature 98.6 F (37 C), resp. rate 16, height 5\' 5"  (1.651 m), weight 90 kg, last menstrual period 09/27/2018, SpO2 98 %. General appearance: alert and no distress Resp: clear to auscultation bilaterally Cardio: regular rate and rhythm GI: soft, non-tender; bowel sounds normal; no masses,  no organomegaly Incision/Wound:C/D/I  Disposition: Discharge disposition: 01-Home or Self Care       Discharge Instructions    Call MD for:  persistant nausea and vomiting   Complete by:  As directed    Call MD for:  redness, tenderness, or signs of infection (pain, swelling, redness, odor or green/yellow discharge around incision site)   Complete by:  As directed    Call MD for:  severe uncontrolled pain   Complete by:  As directed    Diet - low sodium heart healthy   Complete by:  As directed    Discharge instructions   Complete by:  As directed    Call [phone] with questions or problems   Driving Restrictions   Complete by:  As directed    While taking strong pain medicine   Increase activity slowly   Complete by:  As directed    Lifting restrictions   Complete by:  As directed    No greater than 10-15lbs for 6 weeks   May shower / Bathe   Complete by:  As directed    May walk up  steps   Complete by:  As directed    Sexual Activity Restrictions   Complete by:  As directed    Pelvic rest - no douching, tampons or sex for 6 weeks     Allergies as of 09/27/2018   No Known Allergies     Medication List    TAKE these medications   buPROPion 300 MG 24 hr tablet Commonly known as:  WELLBUTRIN XL Take 1 tablet (300 mg total) by mouth daily. What changed:  when to take this   doxylamine (Sleep) 25 MG tablet Commonly known as:  UNISOM Take 25 mg by mouth at bedtime as needed for sleep.   hydrochlorothiazide 25 MG tablet Commonly known as:  HYDRODIURIL Take 1 tablet (25 mg total) by mouth daily. What changed:  when to take this   ibuprofen 800 MG tablet Commonly known as:  ADVIL,MOTRIN Take 1 tablet (800 mg total) by mouth every 8 (eight) hours as needed (mild pain).   oxyCODONE-acetaminophen 5-325 MG tablet Commonly known as:  PERCOCET/ROXICET Take 1-2 tablets by mouth every 3 (three) hours as needed for severe pain (moderate to severe pain (when tolerating fluids)).   pantoprazole 40 MG tablet Commonly known as:  PROTONIX Take 1 tablet (40 mg total) by mouth daily. What changed:  when to take this   promethazine 12.5 MG tablet Commonly known as:  PHENERGAN Take 1 tablet (12.5 mg total) by mouth every 6 (six) hours as needed for nausea or vomiting.  sertraline 100 MG tablet Commonly known as:  ZOLOFT Take 1 tablet (100 mg total) by mouth daily. What changed:  when to take this      Follow-up Information    Bovard-Stuckert, Lyanna Blystone, MD. Schedule an appointment as soon as possible for a visit in 2 week(s).   Specialty:  Obstetrics and Gynecology Why:  for incision check and pathology review, 6 weeks for full post-op check Contact information: [address] [place]  [address] [phone]           Signed: Janyth Contes 09/27/2018, 7:27 PM

## 2018-09-27 NOTE — Interval H&P Note (Signed)
History and Physical Interval Note:  09/27/2018 7:36 AM  Anita Alexander  has presented today for surgery, with the diagnosis of menorrhagia  The various methods of treatment have been discussed with the patient and family. After consideration of risks, benefits and other options for treatment, the patient has consented to  Procedure(s) with comments: [place] (Bilateral) - out pt in bed as a surgical intervention .  The patient's history has been reviewed, patient examined, no change in status, stable for surgery.  I have reviewed the patient's chart and labs.  Questions were answered to the patient's satisfaction.     Hadlee Burback Bovard-Stuckert

## 2018-09-27 NOTE — Brief Op Note (Signed)
09/27/2018  10:07 AM  PATIENT:  Anita Alexander  47 y.o. female  PRE-OPERATIVE DIAGNOSIS:  menorrhagia  POST-OPERATIVE DIAGNOSIS:  menorrhagia  PROCEDURE:  Procedure(s) with comments: LAPAROSCOPIC ASSISTED VAGINAL HYSTERECTOMY (Bilateral) - out pt in bed  SURGEON:  Surgeon(s) and Role:    * Bovard-Stuckert, Yaneth Fairbairn, MD - Primary    * Banga, Cecilia Worema, DO - Assisting  ANESTHESIA:   local and general  FINDINGS: nl uterus and cervix, previous B salpingectomy, nl ovaries.    EBL:  100 mL IVF and uop per anesthesia  BLOOD ADMINISTERED:none  DRAINS: Urinary Catheter (Foley)   LOCAL MEDICATIONS USED:  MARCAINE     SPECIMEN:  Source of Specimen:  uterus and cervix  DISPOSITION OF SPECIMEN:  PATHOLOGY  COUNTS:  YES  TOURNIQUET:  * No tourniquets in log *  DICTATION: .Other Dictation: Dictation Number [id number]  PLAN OF CARE: Admit for overnight observation  PATIENT DISPOSITION:  PACU - hemodynamically stable.   Delay start of Pharmacological VTE agent (>24hrs) due to surgical blood loss or risk of bleeding: not applicable

## 2018-09-27 NOTE — Anesthesia Procedure Notes (Addendum)
Procedure Name: Intubation Date/Time: 09/27/2018 7:48 AM Performed by: Wanita Chamberlain, CRNA Pre-anesthesia Checklist: Timeout performed, Patient being monitored, Suction available, Emergency Drugs available and Patient identified Patient Re-evaluated:Patient Re-evaluated prior to induction Oxygen Delivery Method: Circle system utilized Preoxygenation: Pre-oxygenation with 100% oxygen Induction Type: IV induction Ventilation: Mask ventilation without difficulty Grade View: Grade I Tube type: Oral Tube size: 7.0 mm Number of attempts: 1 Airway Equipment and Method: Stylet (gauze pad between front teeth) Placement Confirmation: breath sounds checked- equal and bilateral,  CO2 detector,  positive ETCO2 and ETT inserted through vocal cords under direct vision Secured at: 21 cm Tube secured with: Tape Dental Injury: Teeth and Oropharynx as per pre-operative assessment

## 2018-09-27 NOTE — Anesthesia Postprocedure Evaluation (Signed)
Anesthesia Post Note  Patient: Anita Alexander  Procedure(s) Performed: LAPAROSCOPIC ASSISTED VAGINAL HYSTERECTOMY (Bilateral Abdomen)     Patient location during evaluation: PACU Anesthesia Type: General Level of consciousness: awake and alert Pain management: pain level controlled Vital Signs Assessment: post-procedure vital signs reviewed and stable Respiratory status: spontaneous breathing, nonlabored ventilation and respiratory function stable Cardiovascular status: blood pressure returned to baseline and stable Postop Assessment: no apparent nausea or vomiting Anesthetic complications: no    Last Vitals:  Vitals:   09/27/18 1134 09/27/18 1157  BP: 139/75 133/67  Pulse:    Resp: 12   Temp: (!) 36.4 C 36.5 C  SpO2: 99% 100%    Last Pain:  Vitals:   09/27/18 1157  TempSrc:   PainSc: 2                  Audry Pili

## 2018-09-27 NOTE — Progress Notes (Signed)
Day of Surgery Procedure(s) (LRB): LAPAROSCOPIC ASSISTED VAGINAL HYSTERECTOMY (Bilateral)  Subjective: Patient reports incisional pain, tolerating PO and no problems voiding.    Objective: I have reviewed patient's vital signs, intake and output, medications and labs.  General: alert and no distress Resp: clear to auscultation bilaterally Cardio: regular rate and rhythm GI: soft, non-tender; bowel sounds normal; no masses,  no organomegaly and incision: clean, dry and intact Extremities: extremities normal, atraumatic, no cyanosis or edema     Assessment: s/p Procedure(s) with comments: LAPAROSCOPIC ASSISTED VAGINAL HYSTERECTOMY (Bilateral) - out pt in bed: stable and progressing well  Plan: Advance diet Encourage ambulation Advance to PO medication Discharge home as is ambulating, voiding, tolerating diet with well-controlled pain  LOS: 0 days    Anita Alexander 09/27/2018, 7:30 PM

## 2018-09-28 ENCOUNTER — Encounter (HOSPITAL_BASED_OUTPATIENT_CLINIC_OR_DEPARTMENT_OTHER): Payer: Self-pay | Admitting: Obstetrics and Gynecology

## 2018-09-28 NOTE — Op Note (Signed)
Anita Alexander, [facility] MEDICAL RECORD [id number] ACCOUNT [id number] DATE OF BIRTH:03/14/1971 FACILITY: [facility] LOCATION: [id number] PHYSICIAN:Amauri Keefe BOVARD-STUCKERT, MD  OPERATIVE REPORT  DATE OF PROCEDURE:  09/27/2018  PREOPERATIVE DIAGNOSES:  Menorrhagia, desires definitive management.  POSTOPERATIVE DIAGNOSES:  Menorrhagia, desires definitive management.  PROCEDURE:  Laparoscopic-assisted vaginal hysterectomy, lysis of adhesions.  SURGEON:  Janyth Contes, MD  ASSISTANTCrawford Givens, DO  ANESTHESIA:  Local and general.  FINDINGS:  Normal uterus and cervix, previous bilateral salpingectomy, normal ovaries.  ESTIMATED BLOOD LOSS:  Approximately 100 mL.  IV FLUIDS AND URINE OUTPUT:  Per anesthesia.  COMPLICATIONS:  None.  PATHOLOGY:  Uterus and cervix.  DESCRIPTION OF PROCEDURE:  After informed consent was reviewed with the patient including risks, benefits and alternatives of the surgical procedure, she was transported to the operating room where she was placed in supine position.  Arms were tucked.   She was then placed in the [place].  General anesthesia was induced and found to be adequate.  After an appropriate timeout was performed, Anita Hulka tenaculum was placed in her uterus with the aid of an open-sided speculum and Anita single-tooth  tenaculum.  The gloves and gown were changed.  Attention was turned to the abdominal portion of the case.  An approximately 5 mm infraumbilical incision was made with Anita hemostat to clear off the fascia, and then Anita Veress needle pneumoperitoneum was  obtained after placement of the Veress needle, passing the hanging drop test.  Gas was used to create Anita pneumoperitoneum.  After approximately 2 L had been insufflated, Anita 5 mm trocar was placed under direct visualization.  The Veress needle had an  opening pressure of 1.5.  The trocar was placed under direct visualization after the placement of the trocar.  Accessory trocars were placed  on both the right and left.  The uterus was visualized, as were the ovaries.  The round ligament and cardinal  ligaments were ligated bilaterally with the Harmonic scalpel at the level of the uterine artery.  The bladder flap was created using harmonic scalpel in the midline.  Bleeding was controlled.  Attention was turned to the vaginal portion of the case.  Using Anita heavy  weighted speculum and the Sims retractor, her cervix was easily visualized.  Approximately 15 mL of vasopressin was instilled into the uterus and then circumscribed with Bovie cautery.  The anterior peritoneum was entered.  The peritoneum was then  entered as well.  The uterosacral ligaments were clamped and ligated and held bilaterally.  Using Anita Harmonic handheld device, the cardinal ligaments were dissected bilaterally on the right until it met up with the pedicle that had been made from above.   On the left, several pedicles were created with the Harmonic scalpel.  Anita small area was clamped and cut and ligated.  The uterus was then delivered and passed.  Bleeding at the posterior cuff was controlled with Bovie cautery.  The ureterosacral  ligaments were plicated.  The held sutures were tied together.  The cuff was closed after inspection of the pedicles showed hemostasis with 2-0 Vicryl in Anita running locked fashion.  During the procedure, fluorescein had been given to the patient, and Anita  cystoscopy was performed with Anita 30-degree scope that was seen from bilateral ureters.  Gloves and gown were changed.  Attention was turned to the abdominal portion of the case.  The pneumoperitoneum was reobtained, and the small area of bleeding was controlled with  the Harmonic scalpel.  The pedicles  were all noted to be hemostatic.  The pneumoperitoneum was relieved, and the pedicles were still hemostatic.  The ports were removed under direct visualization and pneumoperitoneum was evacuated.  The ports were closed  with 3-0 Vicryl subcuticularly and  Dermabond.  The patient tolerated the procedure well.  Sponge, lap, and needle counts were correct x2 per the operating staff.  The patient was taken in stable condition to the PACU.     LN/NUANCE  D:09/27/2018 T:09/28/2018 JOB:[id number]

## 2018-10-31 ENCOUNTER — Other Ambulatory Visit: Payer: Self-pay | Admitting: Family Medicine

## 2018-11-05 ENCOUNTER — Encounter: Payer: Self-pay | Admitting: Physician Assistant

## 2018-11-05 ENCOUNTER — Ambulatory Visit (INDEPENDENT_AMBULATORY_CARE_PROVIDER_SITE_OTHER): Payer: 59 | Admitting: Physician Assistant

## 2018-11-05 VITALS — BP 143/82 | HR 95 | Temp 98.7°F | Wt 203.0 lb

## 2018-11-05 DIAGNOSIS — R058 Other specified cough: Secondary | ICD-10-CM

## 2018-11-05 DIAGNOSIS — J019 Acute sinusitis, unspecified: Secondary | ICD-10-CM

## 2018-11-05 DIAGNOSIS — R05 Cough: Secondary | ICD-10-CM

## 2018-11-05 DIAGNOSIS — R071 Chest pain on breathing: Secondary | ICD-10-CM

## 2018-11-05 DIAGNOSIS — R0789 Other chest pain: Secondary | ICD-10-CM

## 2018-11-05 MED ORDER — HYDROCOD POLST-CPM POLST ER 10-8 MG/5ML PO SUER: 5.0000 mL | Freq: Two times a day (BID) | ORAL | 0 refills | Status: AC | PRN

## 2018-11-05 MED ORDER — AMOXICILLIN-POT CLAVULANATE 875-125 MG PO TABS: 1.0000 | ORAL_TABLET | Freq: Two times a day (BID) | ORAL | 0 refills | Status: AC

## 2018-11-05 MED ORDER — IPRATROPIUM BROMIDE 0.06 % NA SOLN: 2.0000 | Freq: Four times a day (QID) | NASAL | 0 refills | Status: DC | PRN

## 2018-11-05 NOTE — Patient Instructions (Signed)
For nasal symptoms/sinusitis: - nasal saline rinses / netti pot several times per day (do this prior to nasal spray) - prescription Atrovent nasal spray: 2 sprays each nostril, up to 4 times per day as needed - you can use OTC nasal decongestant sprays like Afrin (oxymetazoline) BUT do not use for more than 3 days as it will cause worsening congestion/nasal symptoms) - warm facial compresses - oral decongestants and antihistamines like Claritin-D and Zyrtec-D may help dry up secretions (caution using decongestants if you have high blood pressure, heart disease or kidney disease) - for sinus headache: Tylenol 1000mg  every 8 hours as needed. Alternate with Ibuprofen 600mg  every 6 hours  For sore throat: - Tylenol 1000mg  every 8 hours as needed for throat pain. Alternate with Ibuprofen 600mg  every 6 hours - Cepacol throat lozenges and/or Chloraseptic spray - Warm salt water gargles  For cough: - Cough is a protective mechanism and an important part of fighting an infection. I encourage you to avoid suppressing your cough with medication during the day if possible.  - Mucinex with at least 8 oz. of water can loosen chest congestion and make cough more productive, which means you will actually cough less - Okay to use a cough suppressant at bedtime in order to rest  Note: follow package instructions for all over-the-counter medications. If using multi-symptom medications (Dayquil, Theraflu, etc.), check the label for duplicate drug ingredients.   Sinusitis, Adult Sinusitis is inflammation of your sinuses. Sinuses are hollow spaces in the bones around your face. Your sinuses are located:  Around your eyes.  In the middle of your forehead.  Behind your nose.  In your cheekbones. Mucus normally drains out of your sinuses. When your nasal tissues become inflamed or swollen, mucus can become trapped or blocked. This allows bacteria, viruses, and fungi to grow, which leads to infection. Most  infections of the sinuses are caused by a virus. Sinusitis can develop quickly. It can last for up to 4 weeks (acute) or for more than 12 weeks (chronic). Sinusitis often develops after a cold. What are the causes? This condition is caused by anything that creates swelling in the sinuses or stops mucus from draining. This includes:  Allergies.  Asthma.  Infection from bacteria or viruses.  Deformities or blockages in your nose or sinuses.  Abnormal growths in the nose (nasal polyps).  Pollutants, such as chemicals or irritants in the air.  Infection from fungi (rare). What increases the risk? You are more likely to develop this condition if you:  Have a weak body defense system (immune system).  Do a lot of swimming or diving.  Overuse nasal sprays.  Smoke. What are the signs or symptoms? The main symptoms of this condition are pain and a feeling of pressure around the affected sinuses. Other symptoms include:  Stuffy nose or congestion.  Thick drainage from your nose.  Swelling and warmth over the affected sinuses.  Headache.  Upper toothache.  A cough that may get worse at night.  Extra mucus that collects in the throat or the back of the nose (postnasal drip).  Decreased sense of smell and taste.  Fatigue.  A fever.  Sore throat.  Bad breath. How is this diagnosed? This condition is diagnosed based on:  Your symptoms.  Your medical history.  A physical exam.  Tests to find out if your condition is acute or chronic. This may include: ? Checking your nose for nasal polyps. ? Viewing your sinuses using a device  that has a light (endoscope). ? Testing for allergies or bacteria. ? Imaging tests, such as an MRI or CT scan. In rare cases, a bone biopsy may be done to rule out more serious types of fungal sinus disease. How is this treated? Treatment for sinusitis depends on the cause and whether your condition is chronic or acute.  If caused by a  virus, your symptoms should go away on their own within 10 days. You may be given medicines to relieve symptoms. They include: ? Medicines that shrink swollen nasal passages (topical intranasal decongestants). ? Medicines that treat allergies (antihistamines). ? A spray that eases inflammation of the nostrils (topical intranasal corticosteroids). ? Rinses that help get rid of thick mucus in your nose (nasal saline washes).  If caused by bacteria, your health care provider may recommend waiting to see if your symptoms improve. Most bacterial infections will get better without antibiotic medicine. You may be given antibiotics if you have: ? A severe infection. ? A weak immune system.  If caused by narrow nasal passages or nasal polyps, you may need to have surgery. Follow these instructions at home: Medicines  Take, use, or apply over-the-counter and prescription medicines only as told by your health care provider. These may include nasal sprays.  If you were prescribed an antibiotic medicine, take it as told by your health care provider. Do not stop taking the antibiotic even if you start to feel better. Hydrate and humidify   Drink enough fluid to keep your urine pale yellow. Staying hydrated will help to thin your mucus.  Use a cool mist humidifier to keep the humidity level in your home above 50%.  Inhale steam for 10-15 minutes, 3-4 times a day, or as told by your health care provider. You can do this in the bathroom while a hot shower is running.  Limit your exposure to cool or dry air. Rest  Rest as much as possible.  Sleep with your head raised (elevated).  Make sure you get enough sleep each night. General instructions   Apply a warm, moist washcloth to your face 3-4 times a day or as told by your health care provider. This will help with discomfort.  Wash your hands often with soap and water to reduce your exposure to germs. If soap and water are not available, use hand  sanitizer.  Do not smoke. Avoid being around people who are smoking (secondhand smoke).  Keep all follow-up visits as told by your health care provider. This is important. Contact a health care provider if:  You have a fever.  Your symptoms get worse.  Your symptoms do not improve within 10 days. Get help right away if:  You have a severe headache.  You have persistent vomiting.  You have severe pain or swelling around your face or eyes.  You have vision problems.  You develop confusion.  Your neck is stiff.  You have trouble breathing. Summary  Sinusitis is soreness and inflammation of your sinuses. Sinuses are hollow spaces in the bones around your face.  This condition is caused by nasal tissues that become inflamed or swollen. The swelling traps or blocks the flow of mucus. This allows bacteria, viruses, and fungi to grow, which leads to infection.  If you were prescribed an antibiotic medicine, take it as told by your health care provider. Do not stop taking the antibiotic even if you start to feel better.  Keep all follow-up visits as told by your health care provider.  This is important. This information is not intended to replace advice given to you by your health care provider. Make sure you discuss any questions you have with your health care provider. Document Released: 10/24/2005 Document Revised: 03/26/2018 Document Reviewed: 03/26/2018 Elsevier Interactive Patient Education  2019 [employer].

## 2018-11-05 NOTE — Progress Notes (Addendum)
HPI:                                                                Anita Alexander is a 47 y.o. female who presents to [facility]: [facility] today for cough  Cough  This is a new problem. The current episode started 1 to 4 weeks ago. The problem has been unchanged. The problem occurs every few minutes. The cough is non-productive. Associated symptoms include chest pain (precordial tightness), nasal congestion and rhinorrhea. Pertinent negatives include no ear pain, fever or wheezing. Associated symptoms comments: + sinus pressure. Nothing aggravates the symptoms. She has tried OTC cough suppressant for the symptoms. The treatment provided no relief. There is no history of asthma, COPD or pneumonia.       Past Medical History:  Diagnosis Date  . Achilles tendinitis, left leg   . ADHD   . Anemia   . GAD (generalized anxiety disorder)   . GERD (gastroesophageal reflux disease)   . Headache(784.0)    migraine  . 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 HTN (hypertension)   . Internal hemorrhoid 07/17/2018   Found on colonoscopy August 2019  . MDD (major depressive disorder)   . Plantar fasciitis    left  . Wears contact lenses    Past Surgical History:  Procedure Laterality Date  . COLONOSCOPY  07/06/2018  . DILATION AND CURETTAGE OF UTERUS  03-05-2009   dr Willis Modena  @[facility]    w/ suction for missed ab  . FINGER SURGERY Right 1993   cyst removed  . LAPAROSCOPIC VAGINAL HYSTERECTOMY WITH SALPINGECTOMY Bilateral 09/27/2018   Procedure: LAPAROSCOPIC ASSISTED VAGINAL HYSTERECTOMY;  Surgeon: Janyth Contes, MD;  Location: [facility];  Service: Gynecology;  Laterality: Bilateral;  out pt in bed  . OVUM / OOCYTE RETRIEVAL  x2  last one 04-11-2013  . TRANSTHORACIC ECHOCARDIOGRAM  07/27/2016   ef 55-60%/  trivial MR (no evidence mvp)  . TUBAL LIGATION Bilateral 07/10/2014   Procedure: Post Partum Tubal Ligation,  Bilateral Salpingectomy;  Surgeon: Janyth Contes, MD;  Location: [facility];  Service: Gynecology;  Laterality: Bilateral;  . WISDOM TOOTH EXTRACTION     Social History   Tobacco Use  . Smoking status: Former Smoker    Packs/day: 1.00    Years: 7.00    Pack years: 7.00    Types: Cigarettes    Last attempt to quit: 11/07/1998    Years since quitting: 20.0  . Smokeless tobacco: Never Used  Substance Use Topics  . Alcohol use: Yes    Alcohol/week: 14.0 standard drinks    Types: 14 Glasses of wine per week    Comment: 2 wine daily   family history includes Hyperlipidemia in her unknown relative; Hypertension in her father.    ROS: negative except as noted in the HPI  Medications: Current Outpatient Medications  Medication Sig Dispense Refill  . amoxicillin-clavulanate (AUGMENTIN) 875-125 MG tablet Take 1 tablet by mouth 2 (two) times daily for 7 days. 14 tablet 0  . buPROPion (WELLBUTRIN XL) 300 MG 24 hr tablet Take 1 tablet (300 mg total) by mouth daily. Due for follow up visit w/PCP 15 tablet 0  . chlorpheniramine-HYDROcodone (TUSSIONEX) 10-8 MG/5ML SUER Take 5 mLs  by mouth every 12 (twelve) hours as needed for up to 5 days for cough. 50 mL 0  . doxylamine, Sleep, (UNISOM) 25 MG tablet Take 25 mg by mouth at bedtime as needed for sleep.     . hydrochlorothiazide (HYDRODIURIL) 25 MG tablet Take 1 tablet (25 mg total) by mouth daily. Due for follow up visit w/PCP 15 tablet 0  . ibuprofen (ADVIL,MOTRIN) 800 MG tablet Take 1 tablet (800 mg total) by mouth every 8 (eight) hours as needed (mild pain). 30 tablet 0  . ipratropium (ATROVENT) 0.06 % nasal spray Place 2 sprays into both nostrils 4 (four) times daily as needed for rhinitis. 15 mL 0  . oxyCODONE-acetaminophen (PERCOCET/ROXICET) 5-325 MG tablet Take 1-2 tablets by mouth every 3 (three) hours as needed for severe pain (moderate to severe pain (when tolerating fluids)). 30 tablet 0  . pantoprazole (PROTONIX) 40 MG tablet Take 1  tablet (40 mg total) by mouth daily. (Patient taking differently: Take 40 mg by mouth every morning. ) 90 tablet 3  . promethazine (PHENERGAN) 12.5 MG tablet Take 1 tablet (12.5 mg total) by mouth every 6 (six) hours as needed for nausea or vomiting. 15 tablet 0  . sertraline (ZOLOFT) 100 MG tablet Take 1 tablet (100 mg total) by mouth daily. (Patient taking differently: Take 100 mg by mouth every morning. ) 90 tablet 3   No current facility-administered medications for this visit.    No Known Allergies     Objective:  BP (!) 143/82   Pulse 95   Temp 98.7 F (37.1 C) (Oral)   Wt 203 lb (92.1 kg)   SpO2 97%   BMI 33.78 kg/m  Gen:  alert, not ill-appearing, no distress, appropriate for age 26: head normocephalic without obvious abnormality, conjunctiva and cornea clear, left ear canal partially obstructed by cerumen, right TM pearly gray and semitransparent, nasal mucosa edematous, right-sided maxillary sinus tenderness, oropharynx clear, moist mucous membranes, no cervical adenopathy, neck supple trachea midline Pulm: Normal work of breathing, normal phonation, clear to auscultation bilaterally, no wheezes, rales or rhonchi; bronchitic cough CV: Normal rate, regular rhythm, s1 and s2 distinct, no murmurs, clicks or rubs  Neuro: alert and oriented x 3, no tremor MSK: chest wall diffusely tender across the precordium, extremities atraumatic, normal gait and station Skin: intact, no rashes on exposed skin, no jaundice, no cyanosis   No results found for this or any previous visit (from the past 72 hour(s)). No results found.    Assessment and Plan: 47 y.o. female with   .Anita Alexander was seen today for cough.  Diagnoses and all orders for this visit:  Acute non-recurrent sinusitis, unspecified location -     amoxicillin-clavulanate (AUGMENTIN) 875-125 MG tablet; Take 1 tablet by mouth 2 (two) times daily for 7 days. -     ipratropium (ATROVENT) 0.06 % nasal spray; Place 2 sprays into  both nostrils 4 (four) times daily as needed for rhinitis.  Nonproductive cough -     chlorpheniramine-HYDROcodone (TUSSIONEX) 10-8 MG/5ML SUER; Take 5 mLs by mouth every 12 (twelve) hours as needed for up to 5 days for cough.  Costochondral chest pain   Afebrile, no tachypnea, no tachycardia, pulse ox 97% on room air, no adventitious lung sounds however unable to fully appreciate expiratory sounds due to bronchitic cough Given persistent symptoms for 10 days we will treat empirically for bacterial sinusitis Counseled on supportive care for cough and costochondritis  Patient education and anticipatory guidance given Patient agrees with  treatment plan Follow-up as needed if symptoms worsen or fail to improve  Darlyne Russian PA-C

## 2018-11-16 LAB — HM MAMMOGRAPHY

## 2018-11-19 ENCOUNTER — Telehealth: Payer: Self-pay | Admitting: Family Medicine

## 2018-11-19 NOTE — Telephone Encounter (Signed)
Received note from [facility].  Patient had hysterectomy November 2019.  Doing quite well.  They plan for screening mammogram.

## 2018-11-29 ENCOUNTER — Encounter: Payer: Self-pay | Admitting: Family Medicine

## 2018-12-05 ENCOUNTER — Other Ambulatory Visit: Payer: Self-pay | Admitting: Family Medicine

## 2019-01-01 ENCOUNTER — Ambulatory Visit (INDEPENDENT_AMBULATORY_CARE_PROVIDER_SITE_OTHER): Payer: 59 | Admitting: Family Medicine

## 2019-01-01 ENCOUNTER — Encounter: Payer: Self-pay | Admitting: Family Medicine

## 2019-01-01 VITALS — BP 150/78 | HR 84 | Ht 65.0 in | Wt 207.0 lb

## 2019-01-01 DIAGNOSIS — I10 Essential (primary) hypertension: Secondary | ICD-10-CM | POA: Diagnosis not present

## 2019-01-01 DIAGNOSIS — F411 Generalized anxiety disorder: Secondary | ICD-10-CM | POA: Diagnosis not present

## 2019-01-01 DIAGNOSIS — Z90710 Acquired absence of both cervix and uterus: Secondary | ICD-10-CM

## 2019-01-01 DIAGNOSIS — F325 Major depressive disorder, single episode, in full remission: Secondary | ICD-10-CM | POA: Diagnosis not present

## 2019-01-01 DIAGNOSIS — R0683 Snoring: Secondary | ICD-10-CM

## 2019-01-01 DIAGNOSIS — Z6834 Body mass index (BMI) 34.0-34.9, adult: Secondary | ICD-10-CM

## 2019-01-01 MED ORDER — PANTOPRAZOLE SODIUM 40 MG PO TBEC: 40.0000 mg | DELAYED_RELEASE_TABLET | Freq: Every day | ORAL | 3 refills | Status: AC

## 2019-01-01 MED ORDER — LISINOPRIL-HYDROCHLOROTHIAZIDE 20-25 MG PO TABS: 1.0000 | ORAL_TABLET | Freq: Every day | ORAL | 1 refills | Status: DC

## 2019-01-01 MED ORDER — BUPROPION HCL ER (XL) 300 MG PO TB24: 300.0000 mg | ORAL_TABLET | Freq: Every day | ORAL | 3 refills | Status: AC

## 2019-01-01 MED ORDER — SERTRALINE HCL 100 MG PO TABS: 100.0000 mg | ORAL_TABLET | Freq: Every day | ORAL | 3 refills | Status: AC

## 2019-01-01 NOTE — Patient Instructions (Signed)
Thank you for coming in today. STOP HCTZ.  Start Lisinopril/HCTZ combo pill.  You should hear about sleep study soon. Let me know if you do not hear anything,  Recheck 1 month for blood pressure. Check with nurse if only blood pressure.  Check with me if also working on weight or to discuss results.   Get fasting labs in about 2 weeks or so.   I recommend scheduling with Dr Leafy Ro [phone]  Hydrochlorothiazide, HCTZ; Lisinopril tablets What is this medicine? HYDROCHLOROTHIAZIDE; LISINOPRIL (hye droe klor oh THYE a zide; lyse IN oh pril) is a combination of a diuretic and an ACE inhibitor. It is used to treat high blood pressure. This medicine may be used for other purposes; ask your health care provider or pharmacist if you have questions. COMMON BRAND NAME(S): Prinzide, Zestoretic What should I tell my health care provider before I take this medicine? They need to know if you have any of these conditions: -bone marrow disease -decreased urine -heart or blood vessel disease -if you are on a special diet like a low salt diet -immune system problems, like lupus -kidney disease -liver disease -previous swelling of the tongue, face, or lips with difficulty breathing, difficulty swallowing, hoarseness, or tightening of the throat -recent heart attack or stroke -an unusual or allergic reaction to lisinopril, hydrochlorothiazide, sulfa drugs, other medicines, insect venom, foods, dyes, or preservatives -pregnant or trying to get pregnant -breast-feeding How should I use this medicine? Take this medicine by mouth with a glass of water. Follow the directions on the prescription label. You can take it with or without food. If it upsets your stomach, take it with food. Take your medicine at regular intervals. Do not take it more often than directed. Do not stop taking except on your doctor's advice. Talk to your pediatrician regarding the use of this medicine in children. Special care may  be needed. Overdosage: If you think you have taken too much of this medicine contact a poison control center or emergency room at once. NOTE: This medicine is only for you. Do not share this medicine with others. What if I miss a dose? If you miss a dose, take it as soon as you can. If it is almost time for your next dose, take only that dose. Do not take double or extra doses. What may interact with this medicine? Do not take this medication with any of the following medications: -sacubitril; valsartan This medicine may also interact with the following: -barbiturates like phenobarbital -blood pressure medicines -corticosteroids like prednisone -diabetic medications -diuretics, especially triamterene, spironolactone or amiloride -lithium -NSAIDs, medicines for pain and inflammation, like ibuprofen or naproxen -potassium salts or potassium supplements -prescription pain medicines -skeletal muscle relaxants like tubocurarine -some cholesterol lowering medications like cholestyramine or colestipol This list may not describe all possible interactions. Give your health care provider a list of all the medicines, herbs, non-prescription drugs, or dietary supplements you use. Also tell them if you smoke, drink alcohol, or use illegal drugs. Some items may interact with your medicine. What should I watch for while using this medicine? Visit your doctor or health care professional for regular checks on your progress. Check your blood pressure as directed. Ask your doctor or health care professional what your blood pressure should be and when you should contact him or her. Call your doctor or health care professional if you notice an irregular or fast heart beat. You must not get dehydrated. Ask your doctor or health care professional how  much fluid you need to drink a day. Check with him or her if you get an attack of severe diarrhea, nausea and vomiting, or if you sweat a lot. The loss of too much body  fluid can make it dangerous for you to take this medicine. Women should inform their doctor if they wish to become pregnant or think they might be pregnant. There is a potential for serious side effects to an unborn child. Talk to your health care professional or pharmacist for more information. You may get drowsy or dizzy. Do not drive, use machinery, or do anything that needs mental alertness until you know how this drug affects you. Do not stand or sit up quickly, especially if you are an older patient. This reduces the risk of dizzy or fainting spells. Alcohol can make you more drowsy and dizzy. Avoid alcoholic drinks. This medicine may affect your blood sugar level. If you have diabetes, check with your doctor or health care professional before changing the dose of your diabetic medicine. Avoid salt substitutes unless you are told otherwise by your doctor or health care professional. This medicine can make you more sensitive to the sun. Keep out of the sun. If you cannot avoid being in the sun, wear protective clothing and use sunscreen. Do not use sun lamps or tanning beds/booths. Do not treat yourself for coughs, colds, or pain while you are taking this medicine without asking your doctor or health care professional for advice. Some ingredients may increase your blood pressure. What side effects may I notice from receiving this medicine? Side effects that you should report to your doctor or health care professional as soon as possible: -changes in vision -confusion, dizziness, light headedness or fainting spells -decreased amount of urine passed -difficulty breathing or swallowing, hoarseness, or tightening of the throat -eye pain -fast or irregular heart beat, palpitations, or chest pain -muscle cramps -nausea and vomiting -persistent dry cough -redness, blistering, peeling or loosening of the skin, including inside the mouth -stomach pain -swelling of your face, lips, tongue, hands, or  feet -unusual rash, bleeding or bruising, or pinpoint red spots on the skin -worsened gout pain -yellowing of the eyes or skin Side effects that usually do not require medical attention (report to your doctor or health care professional if they continue or are bothersome): -change in sex drive or performance -cough -headache This list may not describe all possible side effects. Call your doctor for medical advice about side effects. You may report side effects to FDA at 1-800-FDA-1088. Where should I keep my medicine? Keep out of the reach of children. Store at room temperature between 20 and 25 degrees C (68 and 77 degrees F). Protect from moisture and excessive light. Keep container tightly closed. Throw away any unused medicine after the expiration date. NOTE: This sheet is a summary. It may not cover all possible information. If you have questions about this medicine, talk to your doctor, pharmacist, or health care provider.  2019 Elsevier/Gold Standard (2015-12-18 11:42:20)

## 2019-01-01 NOTE — Progress Notes (Signed)
Anita Alexander is a 48 y.o. female who presents to [facility] today for hypertension, obesity, mood.  Anita Alexander has hypertension that previously was managed with hydrochlorothiazide.  Her blood pressure has been elevated at home recheck recently with blood pressures typically in the systolic 076A range.  She denies chest pain or palpitation.  She notes that she occasionally snores and feels fatigued in the morning.  She is never has of had evaluation for sleep apnea.  She recently had a hysterectomy therefore is at no risk for pregnancy.  Additionally Anita Alexander has continued to have slow weight gain over the last several years.  She is not sure where this is.  She notes that she does not eat a careful diet and does not exercise regularly.  She would like to lose weight if possible and is willing to work hard on it.  Additionally Anita Alexander has a history of anxiety and depression.  These have been typically pretty well controlled with Wellbutrin and sertraline.  She takes medications regularly would like to continue them.   ROS as above:  Exam:  BP (!) 150/78   Pulse 84   Ht 5\' 5"  (1.651 m)   Wt 207 lb (93.9 kg)   BMI 34.45 kg/m  Wt Readings from Last 5 Encounters:  01/01/19 207 lb (93.9 kg)  11/05/18 203 lb (92.1 kg)  09/27/18 198 lb 6 oz (90 kg)  09/20/18 198 lb 6 oz (90 kg)  07/03/18 200 lb (90.7 kg)    Gen: Well NAD HEENT: EOMI,  MMM no goiter Lungs: Normal work of breathing. CTABL Heart: RRR no MRG Abd: NABS, Soft. Nondistended, Nontender Exts: Brisk capillary refill, warm and well perfused.   Depression screen [facility] 2/9 01/01/2019 02/19/2018 02/16/2017 09/01/2016 07/28/2016  Decreased Interest 1 2 0 0 0  Down, Depressed, Hopeless 1 2 0 0 0  PHQ - 2 Score 2 4 0 0 0  Altered sleeping 1 2 - 1 2  Tired, decreased energy 1 3 - 1 3  Change in appetite 1 3 - 2 2  Feeling bad or  failure about yourself  0 1 - 0 2  Trouble concentrating 0 3 - 0 0  Moving slowly or fidgety/restless 0 0 - 0 0  Suicidal thoughts 0 0 - 0 0  PHQ-9 Score 5 16 - 4 9  Difficult doing work/chores Somewhat difficult Very difficult - - -  Some encounter information is confidential and restricted. Go to Review Flowsheets activity to see all data.   GAD 7 : Generalized Anxiety Score 01/01/2019 02/19/2018 07/28/2016 07/14/2016  Nervous, Anxious, on Edge 0 1 2 3   Control/stop worrying 0 1 3 3   Worry too much - different things 0 1 3 3   Trouble relaxing 0 1 1 3   Restless 0 1 0 2  Easily annoyed or irritable 3 2 3 3   Afraid - awful might happen 0 1 3 3   Total GAD 7 Score 3 8 15 20   Anxiety Difficulty Somewhat difficult Not difficult at all Somewhat difficult Very difficult  Some encounter information is confidential and restricted. Go to Review Flowsheets activity to see all data.    STOP BANG: Snore:     Yes Tired:     yes Observed stop breathing:  no Hypertension:   yes  BMI >35:   no Age >50:   no Neck > 16 inches:  yes Female gender:   no ------------------------------------------  Total:     4/8   Assessment and Plan: 48 y.o. female with  Hypertension: Blood pressure not controlled.  Plan to switch to lisinopril/hydrochlorothiazide and recheck in about a month.  Will get labs during that visit.  Additionally work on weight loss and exercise as listed below.  Additionally will assess for sleep apnea with home sleep study.  Obesity: Significant medical problem.  Patient has had slow weight gain.  Discussed options.  Best option would be following up with Dr. Leafy Ro for medical supervised weight management multidisciplinary clinic.  Otherwise I can proceed with medical supervised weight management.  If she is following up with Dr. Leafy Ro I am sure Dr. Leafy Ro will get lots of labs which will duplicate the labs that I am planning on getting.  Mood: Well-controlled.  Continue current  regimen.  PDMP not reviewed this encounter. Orders Placed This Encounter  Procedures  . CBC  . COMPLETE METABOLIC PANEL WITH GFR  . Lipid Panel w/reflex Direct LDL  . TSH  . Ambulatory referral to Family Practice    Referral Priority:   Routine    Referral Type:   Consultation    Referral Reason:   Specialty Services Required    Referred to Provider:   Starlyn Skeans, MD    Requested Specialty:   Family Medicine    Number of Visits Requested:   1  . Home sleep test    Standing Status:   Future    Standing Expiration Date:   01/02/2020    Order Specific Question:   Where should this test be performed:    Answer:   Other   Meds ordered this encounter  Medications  . lisinopril-hydrochlorothiazide (PRINZIDE,ZESTORETIC) 20-25 MG tablet    Sig: Take 1 tablet by mouth daily.    Dispense:  90 tablet    Refill:  1    Replaces HCTZ  . pantoprazole (PROTONIX) 40 MG tablet    Sig: Take 1 tablet (40 mg total) by mouth daily.    Dispense:  90 tablet    Refill:  3  . sertraline (ZOLOFT) 100 MG tablet    Sig: Take 1 tablet (100 mg total) by mouth daily.    Dispense:  90 tablet    Refill:  3  . buPROPion (WELLBUTRIN XL) 300 MG 24 hr tablet    Sig: Take 1 tablet (300 mg total) by mouth daily.    Dispense:  90 tablet    Refill:  3     Historical information moved to improve visibility of documentation.  Past Medical History:  Diagnosis Date  . Achilles tendinitis, left leg   . ADHD   . Anemia   . GAD (generalized anxiety disorder)   . GERD (gastroesophageal reflux disease)   . Headache(784.0)    migraine  . 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 HTN (hypertension)   . Internal hemorrhoid 07/17/2018   Found on colonoscopy August 2019  . MDD (major depressive disorder)   . Plantar fasciitis    left  . Wears contact lenses    Past Surgical History:  Procedure Laterality Date  . COLONOSCOPY  07/06/2018  . DILATION AND CURETTAGE OF UTERUS  03-05-2009   dr  Willis Modena  @[facility]    w/ suction for missed ab  . FINGER SURGERY Right 1993   cyst removed  . LAPAROSCOPIC VAGINAL HYSTERECTOMY WITH SALPINGECTOMY Bilateral 09/27/2018   Procedure: LAPAROSCOPIC ASSISTED VAGINAL HYSTERECTOMY;  Surgeon: Janyth Contes, MD;  Location: [facility];  Service: Gynecology;  Laterality: Bilateral;  out pt in bed  . OVUM / OOCYTE RETRIEVAL  x2  last one 04-11-2013  . TRANSTHORACIC ECHOCARDIOGRAM  07/27/2016   ef 55-60%/  trivial MR (no evidence mvp)  . TUBAL LIGATION Bilateral 07/10/2014   Procedure: Post Partum Tubal Ligation, Bilateral Salpingectomy;  Surgeon: Janyth Contes, MD;  Location: [facility];  Service: Gynecology;  Laterality: Bilateral;  . WISDOM TOOTH EXTRACTION     Social History   Tobacco Use  . Smoking status: Former Smoker    Packs/day: 1.00    Years: 7.00    Pack years: 7.00    Types: Cigarettes    Last attempt to quit: 11/07/1998    Years since quitting: 20.1  . Smokeless tobacco: Never Used  Substance Use Topics  . Alcohol use: Yes    Alcohol/week: 14.0 standard drinks    Types: 14 Glasses of wine per week    Comment: 2 wine daily   family history includes Hyperlipidemia in her unknown relative; Hypertension in her father.  Medications: Current Outpatient Medications  Medication Sig Dispense Refill  . buPROPion (WELLBUTRIN XL) 300 MG 24 hr tablet Take 1 tablet (300 mg total) by mouth daily. 90 tablet 3  . doxylamine, Sleep, (UNISOM) 25 MG tablet Take 25 mg by mouth at bedtime as needed for sleep.     . pantoprazole (PROTONIX) 40 MG tablet Take 1 tablet (40 mg total) by mouth daily. 90 tablet 3  . promethazine (PHENERGAN) 12.5 MG tablet Take 1 tablet (12.5 mg total) by mouth every 6 (six) hours as needed for nausea or vomiting. 15 tablet 0  . sertraline (ZOLOFT) 100 MG tablet Take 1 tablet (100 mg total) by mouth daily. 90 tablet 3  . lisinopril-hydrochlorothiazide (PRINZIDE,ZESTORETIC) 20-25 MG tablet Take 1 tablet  by mouth daily. 90 tablet 1   No current facility-administered medications for this visit.    No Known Allergies   Discussed warning signs or symptoms. Please see discharge instructions. Patient expresses understanding.

## 2019-01-21 ENCOUNTER — Ambulatory Visit (INDEPENDENT_AMBULATORY_CARE_PROVIDER_SITE_OTHER): Payer: Self-pay | Admitting: Bariatrics

## 2019-01-30 ENCOUNTER — Ambulatory Visit: Payer: 59

## 2019-02-04 ENCOUNTER — Ambulatory Visit (INDEPENDENT_AMBULATORY_CARE_PROVIDER_SITE_OTHER): Payer: Self-pay | Admitting: Bariatrics

## 2019-02-27 ENCOUNTER — Ambulatory Visit: Payer: 59

## 2019-03-20 ENCOUNTER — Encounter: Payer: Self-pay | Admitting: Family Medicine

## 2019-03-20 ENCOUNTER — Ambulatory Visit (INDEPENDENT_AMBULATORY_CARE_PROVIDER_SITE_OTHER): Payer: 59 | Admitting: Family Medicine

## 2019-03-20 VITALS — BP 122/74 | Wt 198.0 lb

## 2019-03-20 DIAGNOSIS — F325 Major depressive disorder, single episode, in full remission: Secondary | ICD-10-CM | POA: Diagnosis not present

## 2019-03-20 DIAGNOSIS — I10 Essential (primary) hypertension: Secondary | ICD-10-CM | POA: Diagnosis not present

## 2019-03-20 DIAGNOSIS — Z6832 Body mass index (BMI) 32.0-32.9, adult: Secondary | ICD-10-CM | POA: Diagnosis not present

## 2019-03-20 DIAGNOSIS — F411 Generalized anxiety disorder: Secondary | ICD-10-CM | POA: Diagnosis not present

## 2019-03-20 NOTE — Patient Instructions (Addendum)
Thank you for coming in today. Contnue current medicines.  Get fasting labs soon.  Recheck with me in 9 months if all is well.  Continue weight management.  Weight Watchers makes sense.  Continue to get exercise you enjoy for heart and mental health.

## 2019-03-20 NOTE — Progress Notes (Signed)
Virtual Visit  via Video Note  I connected with      Anita Alexander by a video enabled telemedicine application and verified that I am speaking with the correct person using two identifiers.   I discussed the limitations of evaluation and management by telemedicine and the availability of in person appointments. The patient expressed understanding and agreed to proceed.  History of Present Illness: Anita Alexander is a 48 y.o. female who would like to discuss hypertension: Patient has had hypertension previously and had some difficulty controlling her blood pressure.  She was last seen for this in February where her blood pressure was 150/78.  At that time we switched to lisinopril/hydrochlorothiazide.  In the interim she notes that she is doing well.  She denies chest pain headache palpitations lightheadedness or dizziness.  She notes her blood pressures are well controlled at home.  Additionally had anxiety which has been pretty well controlled with Wellbutrin and sertraline.  Also she had had some weight gain and during her last visit in late February plan to refer to the multidisciplinary weight loss clinic however she was unable to schedule due to scheduling conflicts and [id number].  She notes that she is managed to lose some weight by improving her diet.  She is thinking that she might try weight watchers again as she had success with that in the past.    Observations/Objective: BP 122/74   Wt 198 lb (89.8 kg)   BMI 32.95 kg/m  Wt Readings from Last 5 Encounters:  03/20/19 198 lb (89.8 kg)  01/01/19 207 lb (93.9 kg)  11/05/18 203 lb (92.1 kg)  09/27/18 198 lb 6 oz (90 kg)  09/20/18 198 lb 6 oz (90 kg)   Exam: Appearance nontoxic no acute distress Normal Speech.      Assessment and Plan: 48 y.o. female with  Hypertension: Blood pressure much better controlled today.  Continue current regimen and check basic fasting labs listed below.  Obesity: Patient is losing some  weight.  This is excellent news.  I think weight watchers is a good idea.  Will check labs as below but also check TSH.  Recheck 9 months with me or sooner if needed.  PDMP not reviewed this encounter. Orders Placed This Encounter  Procedures  . CBC  . COMPLETE METABOLIC PANEL WITH GFR  . Lipid Panel w/reflex Direct LDL  . TSH   No orders of the defined types were placed in this encounter.   Follow Up Instructions:    I discussed the assessment and treatment plan with the patient. The patient was provided an opportunity to ask questions and all were answered. The patient agreed with the plan and demonstrated an understanding of the instructions.   The patient was advised to call back or seek an in-person evaluation if the symptoms worsen or if the condition fails to improve as anticipated.  Time: 15 minutes of intraservice time, with >22 minutes of total time during today's visit.      Historical information moved to improve visibility of documentation.  Past Medical History:  Diagnosis Date  . Achilles tendinitis, left leg   . ADHD   . Anemia   . GAD (generalized anxiety disorder)   . GERD (gastroesophageal reflux disease)   . Headache(784.0)    migraine  . 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 HTN (hypertension)   . Internal hemorrhoid 07/17/2018   Found on colonoscopy August 2019  . MDD (major depressive  disorder)   . Plantar fasciitis    left  . Wears contact lenses    Past Surgical History:  Procedure Laterality Date  . COLONOSCOPY  07/06/2018  . DILATION AND CURETTAGE OF UTERUS  03-05-2009   dr Willis Modena  @[facility]    w/ suction for missed ab  . FINGER SURGERY Right 1993   cyst removed  . LAPAROSCOPIC VAGINAL HYSTERECTOMY WITH SALPINGECTOMY Bilateral 09/27/2018   Procedure: LAPAROSCOPIC ASSISTED VAGINAL HYSTERECTOMY;  Surgeon: Janyth Contes, MD;  Location: [facility];  Service: Gynecology;  Laterality: Bilateral;  out pt in  bed  . OVUM / OOCYTE RETRIEVAL  x2  last one 04-11-2013  . TRANSTHORACIC ECHOCARDIOGRAM  07/27/2016   ef 55-60%/  trivial MR (no evidence mvp)  . TUBAL LIGATION Bilateral 07/10/2014   Procedure: Post Partum Tubal Ligation, Bilateral Salpingectomy;  Surgeon: Janyth Contes, MD;  Location: [facility];  Service: Gynecology;  Laterality: Bilateral;  . WISDOM TOOTH EXTRACTION     Social History   Tobacco Use  . Smoking status: Former Smoker    Packs/day: 1.00    Years: 7.00    Pack years: 7.00    Types: Cigarettes    Last attempt to quit: 11/07/1998    Years since quitting: 20.3  . Smokeless tobacco: Never Used  Substance Use Topics  . Alcohol use: Yes    Alcohol/week: 14.0 standard drinks    Types: 14 Glasses of wine per week    Comment: 2 wine daily   family history includes Hyperlipidemia in her unknown relative; Hypertension in her father.  Medications: Current Outpatient Medications  Medication Sig Dispense Refill  . buPROPion (WELLBUTRIN XL) 300 MG 24 hr tablet Take 1 tablet (300 mg total) by mouth daily. 90 tablet 3  . doxylamine, Sleep, (UNISOM) 25 MG tablet Take 25 mg by mouth at bedtime as needed for sleep.     Marland Kitchen lisinopril-hydrochlorothiazide (PRINZIDE,ZESTORETIC) 20-25 MG tablet Take 1 tablet by mouth daily. 90 tablet 1  . pantoprazole (PROTONIX) 40 MG tablet Take 1 tablet (40 mg total) by mouth daily. 90 tablet 3  . promethazine (PHENERGAN) 12.5 MG tablet Take 1 tablet (12.5 mg total) by mouth every 6 (six) hours as needed for nausea or vomiting. 15 tablet 0  . sertraline (ZOLOFT) 100 MG tablet Take 1 tablet (100 mg total) by mouth daily. 90 tablet 3   No current facility-administered medications for this visit.    No Known Allergies

## 2019-03-20 NOTE — Progress Notes (Signed)
Attempted to call patient X 3 for nursing portion of visit.

## 2019-04-03 LAB — COMPLETE METABOLIC PANEL WITH GFR
AG Ratio: 1.8 (calc) (ref 1.0–2.5)
ALT: 37 U/L — ABNORMAL HIGH (ref 6–29)
AST: 20 U/L (ref 10–35)
Albumin: 4.4 g/dL (ref 3.6–5.1)
Alkaline phosphatase (APISO): 71 U/L (ref 31–125)
BUN: 19 mg/dL (ref 7–25)
CO2: 25 mmol/L (ref 20–32)
Calcium: 9.5 mg/dL (ref 8.6–10.2)
Chloride: 104 mmol/L (ref 98–110)
Creat: 0.83 mg/dL (ref 0.50–1.10)
GFR, Est African American: 97 mL/min/{1.73_m2} (ref >=60)
GFR, Est Non African American: 84 mL/min/{1.73_m2} (ref >=60)
Globulin: 2.4 g/dL (calc) (ref 1.9–3.7)
Glucose, Bld: 107 mg/dL — ABNORMAL HIGH (ref 65–99)
Potassium: 4.4 mmol/L (ref 3.5–5.3)
Sodium: 137 mmol/L (ref 135–146)
Total Bilirubin: 0.6 mg/dL (ref 0.2–1.2)
Total Protein: 6.8 g/dL (ref 6.1–8.1)

## 2019-04-03 LAB — CBC
HCT: 40.4 % (ref 35.0–45.0)
Hemoglobin: 13.7 g/dL (ref 11.7–15.5)
MCH: 30.9 pg (ref 27.0–33.0)
MCHC: 33.9 g/dL (ref 32.0–36.0)
MCV: 91 fL (ref 80.0–100.0)
MPV: 9.7 fL (ref 7.5–12.5)
Platelets: 260 10*3/uL (ref 140–400)
RBC: 4.44 10*6/uL (ref 3.80–5.10)
RDW: 13.2 % (ref 11.0–15.0)
WBC: 6.1 10*3/uL (ref 3.8–10.8)

## 2019-04-03 LAB — LIPID PANEL W/REFLEX DIRECT LDL
Cholesterol: 210 mg/dL — ABNORMAL HIGH (ref <200)
HDL: 51 mg/dL (ref >=50)
LDL Cholesterol (Calc): 131 mg/dL (calc) — ABNORMAL HIGH
Non-HDL Cholesterol (Calc): 159 mg/dL (calc) — ABNORMAL HIGH (ref <130)
Total CHOL/HDL Ratio: 4.1 (calc) (ref <5.0)
Triglycerides: 165 mg/dL — ABNORMAL HIGH (ref <150)

## 2019-04-03 LAB — TSH: TSH: 1.5 mIU/L

## 2019-05-19 IMAGING — US US ABDOMEN COMPLETE
1 series · 14 of 25 positions shown · non-contrast
Comparison: Ultrasound July 23, 2009.

CLINICAL DATA: Right upper quadrant abdominal pain.

EXAM:
ABDOMEN ULTRASOUND COMPLETE

[Series 1: us abdomen complete · 0.18mm/px · 14 of 80 slices shown]
[im 1/80]
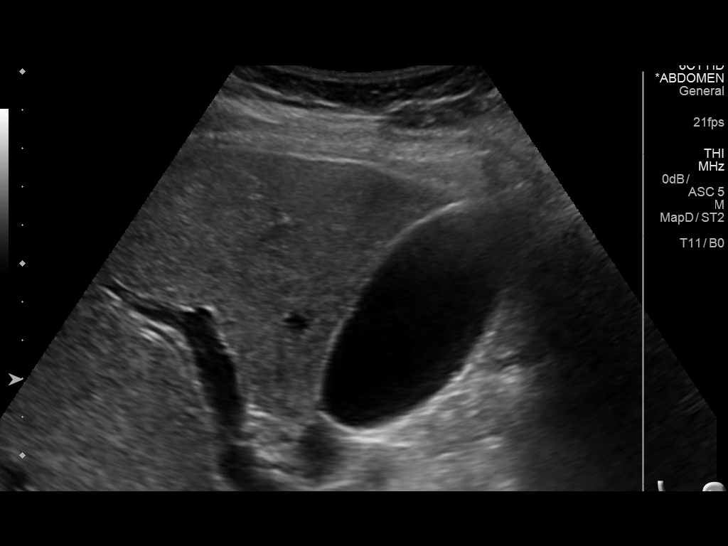
[im 7/80]
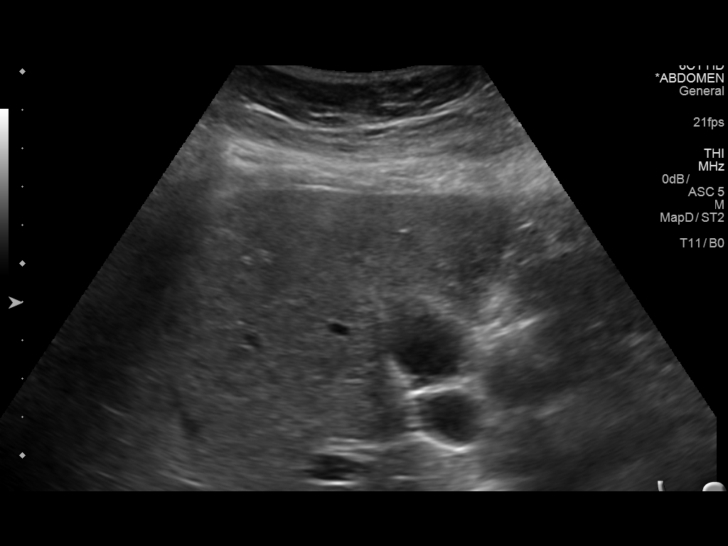
[im 14/80]
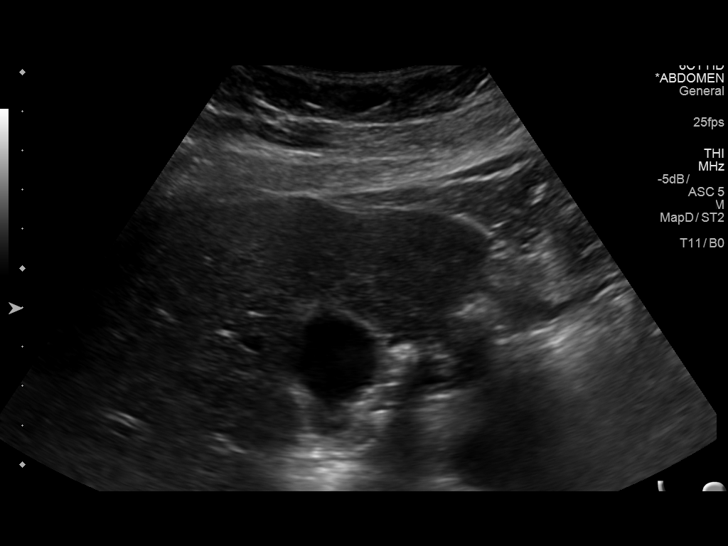
[im 20/80]
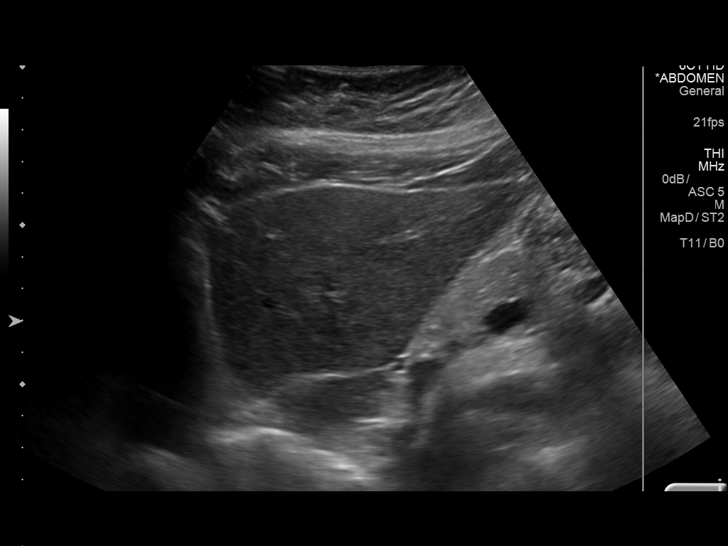
[im 27/80]
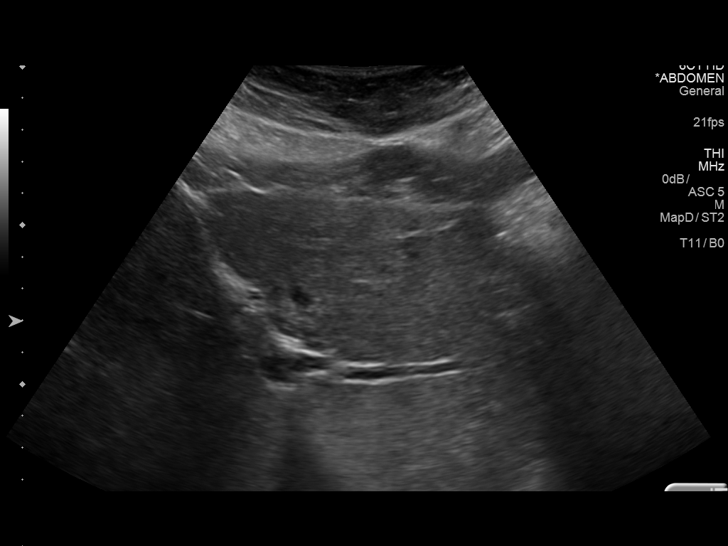
[im 30/80]
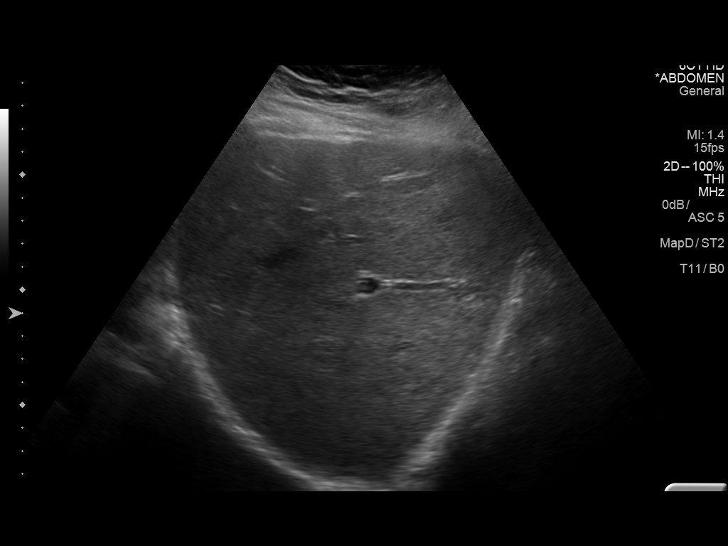
[im 37/80]
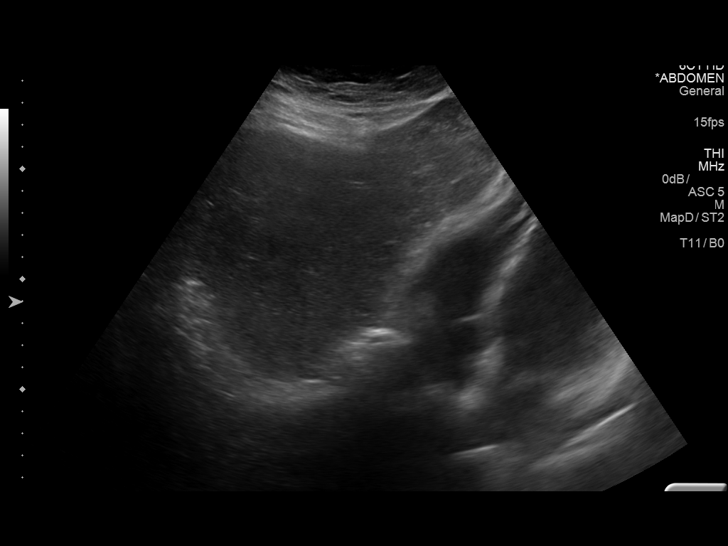
[im 43/80]
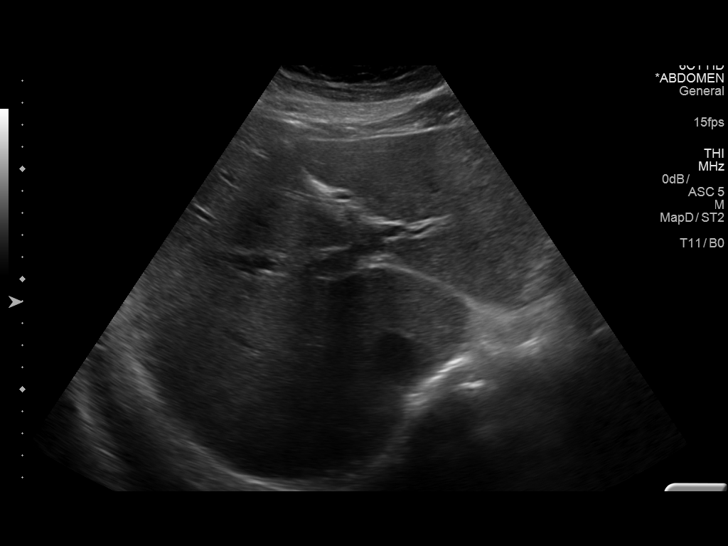
[im 50/80]
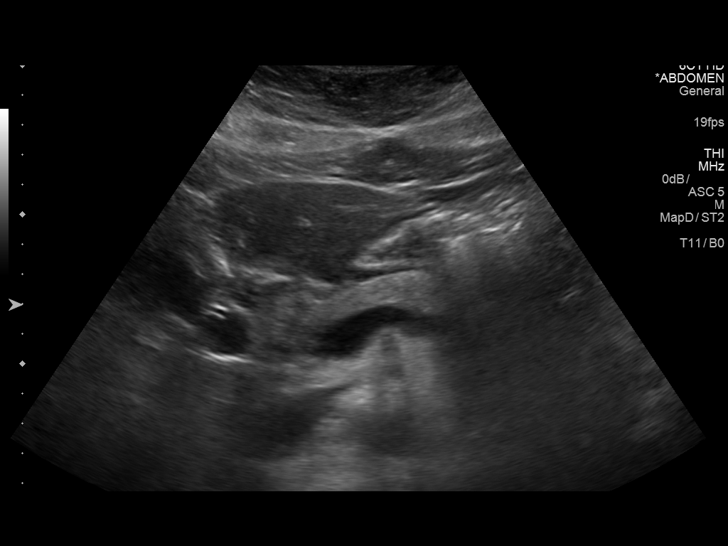
[im 53/80]
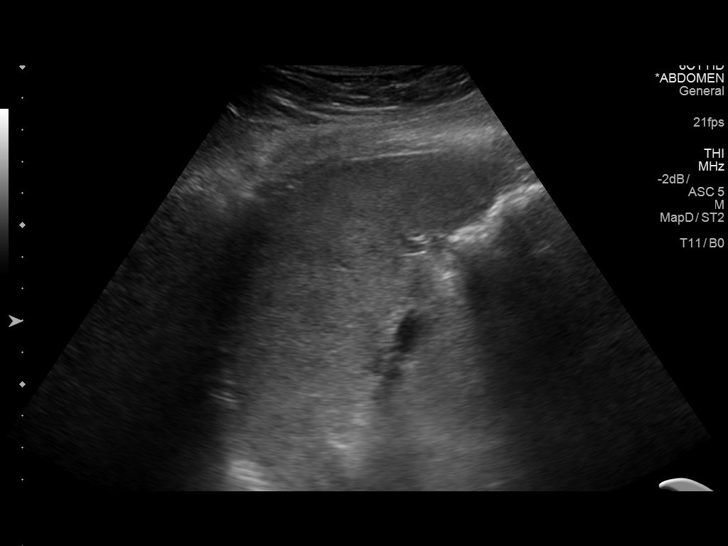
[im 60/80]
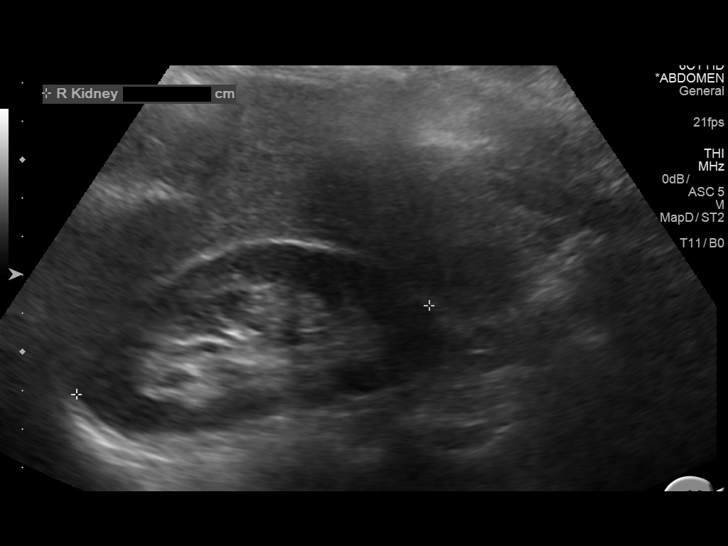
[im 66/80]
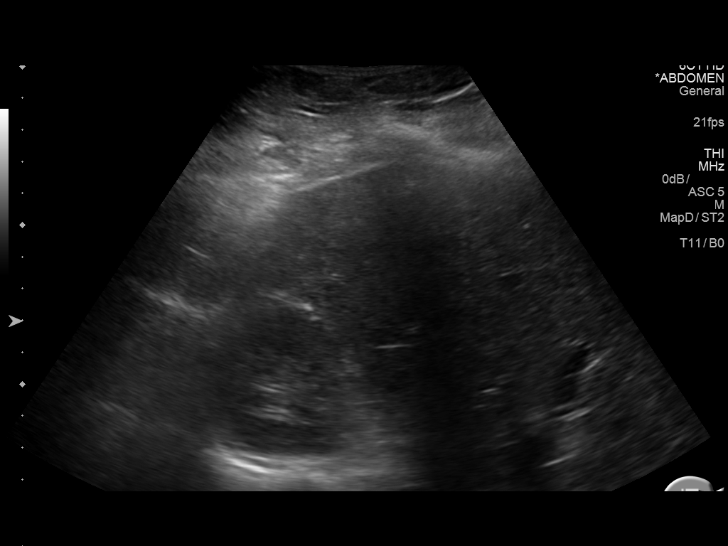
[im 73/80]
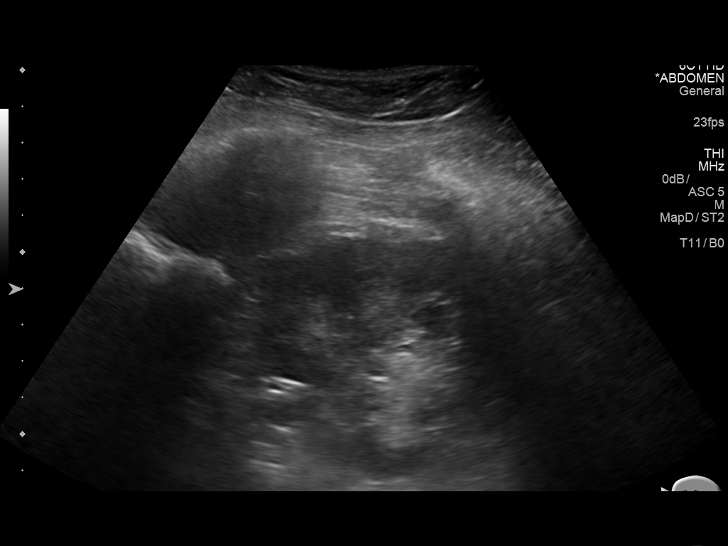
[im 80/80]
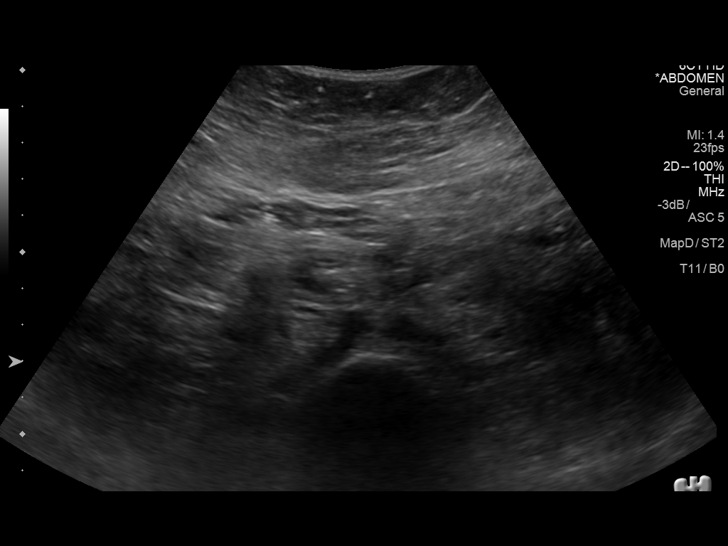

[14 of 25 positions shown; findings below may reference images not displayed]

FINDINGS: Gallbladder: No gallstones or wall thickening visualized. No
sonographic Murphy sign noted by sonographer.

Common bile duct: Diameter: 3 mm which is within normal limits.

Liver: No focal lesion identified. Within normal limits in
parenchymal echogenicity. Portal vein is patent on color Doppler
imaging with normal direction of blood flow towards the liver.

IVC: No abnormality visualized.

Pancreas: Visualized portion unremarkable.

Spleen: Size and appearance within normal limits.

Right Kidney: Length: 9.5 cm. Echogenicity within normal limits. No
mass or hydronephrosis visualized.

Left Kidney: Length: 10.1 cm. Echogenicity within normal limits. No
mass or hydronephrosis visualized.

Abdominal aorta: No aneurysm visualized.

Other findings: None.
IMPRESSION: No definite abnormality seen in the abdomen.

## 2019-09-09 ENCOUNTER — Other Ambulatory Visit: Payer: Self-pay | Admitting: Family Medicine

## 2020-03-20 ENCOUNTER — Telehealth: Payer: Self-pay | Admitting: Emergency Medicine

## 2020-03-20 ENCOUNTER — Other Ambulatory Visit: Payer: Self-pay

## 2020-03-20 ENCOUNTER — Emergency Department
Admission: EM | Admit: 2020-03-20 | Discharge: 2020-03-20 | Disposition: A | Discharge status: Home / Self Care | Payer: 59 | Source: Home / Self Care

## 2020-03-20 ENCOUNTER — Encounter: Payer: Self-pay | Admitting: Emergency Medicine

## 2020-03-20 DIAGNOSIS — R1011 Right upper quadrant pain: Secondary | ICD-10-CM

## 2020-03-20 DIAGNOSIS — R110 Nausea: Secondary | ICD-10-CM | POA: Diagnosis not present

## 2020-03-20 DIAGNOSIS — R197 Diarrhea, unspecified: Secondary | ICD-10-CM | POA: Diagnosis not present

## 2020-03-20 LAB — COMPLETE METABOLIC PANEL WITH GFR
AG Ratio: 2.1 (calc) (ref 1.0–2.5)
ALT: 37 U/L — ABNORMAL HIGH (ref 6–29)
AST: 33 U/L (ref 10–35)
Albumin: 4.5 g/dL (ref 3.6–5.1)
Alkaline phosphatase (APISO): 89 U/L (ref 31–125)
BUN: 12 mg/dL (ref 7–25)
CO2: 26 mmol/L (ref 20–32)
Calcium: 9.3 mg/dL (ref 8.6–10.2)
Chloride: 102 mmol/L (ref 98–110)
Creat: 0.82 mg/dL (ref 0.50–1.10)
GFR, Est African American: 98 mL/min/{1.73_m2} (ref >=60)
GFR, Est Non African American: 85 mL/min/{1.73_m2} (ref >=60)
Globulin: 2.1 g/dL (calc) (ref 1.9–3.7)
Glucose, Bld: 102 mg/dL — ABNORMAL HIGH (ref 65–99)
Potassium: 3.6 mmol/L (ref 3.5–5.3)
Sodium: 137 mmol/L (ref 135–146)
Total Bilirubin: 0.6 mg/dL (ref 0.2–1.2)
Total Protein: 6.6 g/dL (ref 6.1–8.1)

## 2020-03-20 LAB — LIPASE: Lipase: 18 U/L (ref 7–60)

## 2020-03-20 LAB — POCT CBC W AUTO DIFF (K'VILLE URGENT CARE)

## 2020-03-20 LAB — EXTRA LAV TOP TUBE

## 2020-03-20 NOTE — Telephone Encounter (Signed)
Discussed labs, ALT same as it was 11mo ago. Pt wants to hold off on ultrasound and see how symptoms go over the next few days. She does have f/u info for PCP and GI if she decides to f/u.

## 2020-03-20 NOTE — Discharge Instructions (Signed)
  Please follow up with primary care and a GI specialist next week if not improving.   You will be notified later this afternoon if your labs come back abnormal and an urgent/emergent ultrasound is recommended. The ultrasound can be done outpatient at [facility]. If your ultrasound is ordered for today, you will be given more instructions when we call about your labs.    If symptoms worsen- worsening pain, develop fever, vomiting or other new concerning symptoms develop, do not hesitate to call 911 or go to the hospital for further evaluation and treatment.

## 2020-03-20 NOTE — ED Triage Notes (Signed)
RUQ pain - stabbing pain at 0200, lasted for approx 5 min - Nausea with pain, pt ate pizza for dinner Has had similar pain in past, worse w/ last pregnancy Has had an upper/lower endo - no significant finding per pt Takes protonix - intermittent relief

## 2020-03-20 NOTE — Telephone Encounter (Signed)
1051: 2nd call to [facility] to pick up stat labs - confirmation # [id number] Labs picked up at 1111

## 2020-03-20 NOTE — ED Provider Notes (Signed)
[facility]    CSN: [id number] Arrival date & time: 03/20/20  [id number]      History   Chief Complaint Chief Complaint  Patient presents with  . Abdominal Pain    RUQ    HPI Anita Alexander is a 49 y.o. female.   HPI  Anita Alexander is a 49 y.o. female presenting to UC with c/o intermittent RUQ abdominal pain that has been coming and going for years but most recent pain woke her from her sleep at 2AM this morning. Pain was stabbing, wrapped around her upper abdomen and mid back, lasted about 5 minutes. Mild nausea. Pt had eaten pizza for dinner.  Pain is aching, 2/10 at this time.  Similar pain with her last pregnancy a few years ago. She has had scans including a nuclear scan and endoscopy but has never been told her gallbladder needs to come out.  She took protonix with mild relief.  Denies fever or vomiting. She does occasionally have loose stools.    Past Medical History:  Diagnosis Date  . Achilles tendinitis, left leg   . ADHD   . Anemia   . GAD (generalized anxiety disorder)   . GERD (gastroesophageal reflux disease)   . Headache(784.0)    migraine  . 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 HTN (hypertension)   . Internal hemorrhoid 07/17/2018   Found on colonoscopy August 2019  . MDD (major depressive disorder)   . Plantar fasciitis    left  . Wears contact lenses     Patient Active Problem List   Diagnosis Date Noted  . Status post laparoscopic assisted vaginal hysterectomy (LAVH) 09/27/2018  . Internal hemorrhoid 07/17/2018  . Plantar fasciitis of left foot 07/03/2018  . Adult ADHD 02/19/2018  . 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 IBS (irritable bowel syndrome) 12/16/2015  . S/P tubal ligation 07/10/2014  . GAD (generalized anxiety disorder) 02/08/2013  . UNSPECIFIED VITAMIN D DEFICIENCY 06/16/2010  . Major depression in remission ([place]) 06/16/2010  . ESOPHAGEAL REFLUX 06/16/2010  . Fatigue 06/16/2010  . HTN  (hypertension) 06/16/2010    Past Surgical History:  Procedure Laterality Date  . ABDOMINAL HYSTERECTOMY    . COLONOSCOPY  07/06/2018  . DILATION AND CURETTAGE OF UTERUS  03-05-2009   dr Willis Modena  @[facility]    w/ suction for missed ab  . FINGER SURGERY Right 1993   cyst removed  . LAPAROSCOPIC VAGINAL HYSTERECTOMY WITH SALPINGECTOMY Bilateral 09/27/2018   Procedure: LAPAROSCOPIC ASSISTED VAGINAL HYSTERECTOMY;  Surgeon: Janyth Contes, MD;  Location: [facility];  Service: Gynecology;  Laterality: Bilateral;  out pt in bed  . OVUM / OOCYTE RETRIEVAL  x2  last one 04-11-2013  . TRANSTHORACIC ECHOCARDIOGRAM  07/27/2016   ef 55-60%/  trivial MR (no evidence mvp)  . TUBAL LIGATION Bilateral 07/10/2014   Procedure: Post Partum Tubal Ligation, Bilateral Salpingectomy;  Surgeon: Janyth Contes, MD;  Location: [facility];  Service: Gynecology;  Laterality: Bilateral;  . WISDOM TOOTH EXTRACTION      OB History    Gravida  7   Para  3   Term  3   Preterm      AB  4   Living  3     SAB  4   TAB      Ectopic      Multiple      Live Births  3  Home Medications    Prior to Admission medications   Medication Sig Start Date End Date Taking? Authorizing Provider  buPROPion (WELLBUTRIN XL) 300 MG 24 hr tablet Take 1 tablet (300 mg total) by mouth daily. 01/01/19  Yes Gregor Hams, MD  fluticasone Asencion Islam) 50 MCG/ACT nasal spray  01/23/20  Yes [provider]  lisinopril-hydrochlorothiazide (ZESTORETIC) 20-25 MG tablet TAKE ONE TABLET BY MOUTH DAILY 09/09/19  Yes Breeback, Jade L, PA-C  LORazepam (ATIVAN) 1 MG tablet Take 1 mg by mouth 3 (three) times daily. 01/09/20  Yes [provider]  pantoprazole (PROTONIX) 40 MG tablet Take 1 tablet (40 mg total) by mouth daily. 01/01/19 03/20/20 Yes Gregor Hams, MD  sertraline (ZOLOFT) 100 MG tablet Take 1 tablet (100 mg total) by mouth daily. 01/01/19  Yes Gregor Hams, MD  Vitamin D,  Ergocalciferol, (DRISDOL) 1.25 MG (50000 UNIT) CAPS capsule Take 50,000 Units by mouth at bedtime. 01/19/20  Yes [provider]  doxylamine, Sleep, (UNISOM) 25 MG tablet Take 25 mg by mouth at bedtime as needed for sleep.     [provider]  promethazine (PHENERGAN) 12.5 MG tablet Take 1 tablet (12.5 mg total) by mouth every 6 (six) hours as needed for nausea or vomiting. 09/27/18   Bovard-StuckertJeral Fruit, MD    Family History Family History  Problem Relation Age of Onset  . Healthy Mother   . Hypertension Father   . Hyperlipidemia Other     Social History Social History   Tobacco Use  . Smoking status: Former Smoker    Packs/day: 1.00    Years: 7.00    Pack years: 7.00    Types: Cigarettes    Quit date: 11/07/1998    Years since quitting: 21.3  . Smokeless tobacco: Never Used  Substance Use Topics  . Alcohol use: Not Currently    Alcohol/week: 14.0 standard drinks    Types: 14 Glasses of wine per week    Comment:  quit drinking on January 08, 2020  . Drug use: Never     Allergies   Patient has no known allergies.   Review of Systems Review of Systems  Constitutional: Negative for chills and fever.  Respiratory: Negative for chest tightness and shortness of breath.   Cardiovascular: Negative for chest pain and palpitations.  Gastrointestinal: Positive for abdominal pain, diarrhea ( chronic intermittent loose stools) and nausea. Negative for blood in stool and vomiting.  Genitourinary: Negative for dysuria, flank pain, frequency and hematuria.     Physical Exam Triage Vital Signs ED Triage Vitals  Enc Vitals Group     BP 03/20/20 0957 116/82     Pulse Rate 03/20/20 0957 85     Resp 03/20/20 0957 17     Temp 03/20/20 0957 97.6 F (36.4 C)     Temp Source 03/20/20 0957 Oral     SpO2 03/20/20 0957 98 %     Weight 03/20/20 0951 189 lb (85.7 kg)     Height 03/20/20 0951 5\' 5"  (1.651 m)     Head Circumference --      Peak Flow --      Pain Score  03/20/20 0958 2     Pain Loc --      Pain Edu? --      Excl. in [place]? --    No data found.  Updated Vital Signs BP 116/82 (BP Location: Right Arm)   Pulse 85   Temp 97.6 F (36.4 C) (Oral)  Resp 17   Ht 5\' 5"  (1.651 m)   Wt 189 lb (85.7 kg)   LMP 09/27/2018   SpO2 98%   BMI 31.45 kg/m   Visual Acuity Right Eye Distance:   Left Eye Distance:   Bilateral Distance:    Right Eye Near:   Left Eye Near:    Bilateral Near:     Physical Exam Vitals and nursing note reviewed.  Constitutional:      Appearance: She is well-developed.  HENT:     Head: Normocephalic and atraumatic.  Cardiovascular:     Rate and Rhythm: Normal rate and regular rhythm.  Pulmonary:     Effort: Pulmonary effort is normal. No respiratory distress.     Breath sounds: Normal breath sounds.  Abdominal:     General: Bowel sounds are normal. There is no distension.     Palpations: Abdomen is soft.     Tenderness: There is abdominal tenderness in the right upper quadrant. There is no right CVA tenderness, left CVA tenderness, guarding or rebound. Negative signs include Murphy's sign and McBurney's sign.  Musculoskeletal:        General: Normal range of motion.     Cervical back: Normal range of motion.  Skin:    General: Skin is warm and dry.  Neurological:     Mental Status: She is alert and oriented to person, place, and time.  Psychiatric:        Behavior: Behavior normal.      UC Treatments / Results  Labs (all labs ordered are listed, but only abnormal results are displayed) Labs Reviewed  COMPLETE METABOLIC PANEL WITH GFR - Abnormal; Notable for the following components:      Result Value   Glucose, Bld 102 (*)    ALT 37 (*)    All other components within normal limits  LIPASE  EXTRA LAV TOP TUBE  POCT CBC W AUTO DIFF ([facility])    EKG   Radiology No results found.  Procedures Procedures (including critical care time)  Medications Ordered in UC Medications - No  data to display  Initial Impression / Assessment and Plan / UC Course  I have reviewed the triage vital signs and the nursing notes.  Pertinent labs & imaging results that were available during my care of the patient were reviewed by me and considered in my medical decision making (see chart for details).    Suspect cholelithiasis vs cholecystitis Discussed ultrasound and labs, [place] not available at this facility today. Pt would like to wait on labs to determine if urgent US indicated  Encouraged f/u with PCP and GI Discussed symptoms that warrant emergent care in the ED. AVS provided  Final Clinical Impressions(s) / UC Diagnoses   Final diagnoses:  RUQ abdominal pain  Nausea without vomiting  Diarrhea, unspecified type     Discharge Instructions      Please follow up with primary care and a GI specialist next week if not improving.   You will be notified later this afternoon if your labs come back abnormal and an urgent/emergent ultrasound is recommended. The ultrasound can be done outpatient at [facility]. If your ultrasound is ordered for today, you will be given more instructions when we call about your labs.    If symptoms worsen- worsening pain, develop fever, vomiting or other new concerning symptoms develop, do not hesitate to call 911 or go to the hospital for further evaluation and treatment.     ED  Prescriptions    None     PDMP not reviewed this encounter.   Noe Gens, [place] 03/21/20 1428
